# Patient Record
Sex: Female | Born: 1960 | Race: White | Hispanic: No | Marital: Married | Smoking: Never smoker
Health system: Southern US, Community
[De-identification: ages and names within clinical notes are randomized; demographics above are authoritative.]

## PROBLEM LIST (undated history)

## (undated) DIAGNOSIS — S0300XA Dislocation of jaw, unspecified side, initial encounter: Secondary | ICD-10-CM

## (undated) DIAGNOSIS — T7840XA Allergy, unspecified, initial encounter: Secondary | ICD-10-CM

## (undated) DIAGNOSIS — M1990 Unspecified osteoarthritis, unspecified site: Secondary | ICD-10-CM

## (undated) DIAGNOSIS — G43909 Migraine, unspecified, not intractable, without status migrainosus: Secondary | ICD-10-CM

## (undated) DIAGNOSIS — K219 Gastro-esophageal reflux disease without esophagitis: Secondary | ICD-10-CM

## (undated) DIAGNOSIS — N83209 Unspecified ovarian cyst, unspecified side: Secondary | ICD-10-CM

## (undated) DIAGNOSIS — N946 Dysmenorrhea, unspecified: Secondary | ICD-10-CM

## (undated) HISTORY — PX: CHOLECYSTECTOMY: SHX55

## (undated) HISTORY — PX: KNEE SURGERY: SHX244

## (undated) HISTORY — DX: Migraine, unspecified, not intractable, without status migrainosus: G43.909

## (undated) HISTORY — DX: Unspecified ovarian cyst, unspecified side: N83.209

## (undated) HISTORY — DX: Unspecified osteoarthritis, unspecified site: M19.90

## (undated) HISTORY — DX: Dislocation of jaw, unspecified side, initial encounter: S03.00XA

## (undated) HISTORY — DX: Gastro-esophageal reflux disease without esophagitis: K21.9

## (undated) HISTORY — DX: Dysmenorrhea, unspecified: N94.6

## (undated) HISTORY — DX: Allergy, unspecified, initial encounter: T78.40XA

---

## 2011-09-25 HISTORY — PX: ENDOMETRIAL ABLATION: SHX621

## 2012-06-11 LAB — HM COLONOSCOPY

## 2013-05-30 DIAGNOSIS — Z9851 Tubal ligation status: Secondary | ICD-10-CM | POA: Insufficient documentation

## 2013-05-30 HISTORY — DX: Tubal ligation status: Z98.51

## 2013-10-31 ENCOUNTER — Ambulatory Visit (INDEPENDENT_AMBULATORY_CARE_PROVIDER_SITE_OTHER): Payer: Medicare HMO

## 2013-10-31 ENCOUNTER — Ambulatory Visit (INDEPENDENT_AMBULATORY_CARE_PROVIDER_SITE_OTHER): Payer: Medicare HMO | Admitting: Podiatrist

## 2013-10-31 ENCOUNTER — Encounter: Payer: Self-pay | Admitting: Podiatrist

## 2013-10-31 DIAGNOSIS — R52 Pain, unspecified: Secondary | ICD-10-CM

## 2013-10-31 DIAGNOSIS — M7751 Other enthesopathy of right foot: Secondary | ICD-10-CM

## 2013-10-31 DIAGNOSIS — M7750 Other enthesopathy of unspecified foot: Secondary | ICD-10-CM

## 2013-10-31 NOTE — Progress Notes (Signed)
   Subjective:    Patient ID: Julie Thornton, female    DOB: Jun 26, 1960, 53 y.o.   MRN: [id number]  HPI I AM HAVING SOME PAIN IN MY RIGHT ANKLE DUE TO WE WERE MOVING AT THE END OF July AND IT POPS AND CRACKS AND GETS PUFFY WHEN I AM ON IT A LOT AND THROBS AND I DO ICE IT AND FEELS LIKE A KNIFE IN IT AND I TAKE ANTI-FLAMMATORIES    Review of Systems  HENT: Positive for sinus pressure.   All other systems reviewed and are negative.      Objective:   Physical Exam  Patient is awake, alert, and oriented x 3.  In no acute distress.  Vascular status is intact with palpable pedal pulses at 2/4 DP and PT bilateral and capillary refill time within normal limits. Neurological sensation is also intact bilaterally via Semmes Weinstein monofilament at 5/5 sites. Light touch, vibratory sensation, Achilles tendon reflex is intact. Dermatological exam reveals skin color, turger and texture as normal. No open lesions present.  Musculature intact with dorsiflexion, plantarflexion, inversion, eversion.  Pain along the lateral aspect of the ankle especially the sinus tarsi region is palpated. No pain along the anterior talofibular ligament or lateral ankle complex noted. Strength of the ankle is within normal limits. Range of motion is also within normal limits. No popping or crepitus is elicited at today's visit.     Assessment & Plan:  Capsulitis, mild ankle sprain right.  Plan: Injected the sinus tarsi region with dexamethasone and Marcaine mixture without complication under sterile technique. Gave instructions for a soft ankle brace. If the injection is not beneficial she will call.

## 2013-10-31 NOTE — Patient Instructions (Signed)
Acute Ankle Sprain with Phase I Rehab  HEAT AND COLD  Cold treatment (icing) is used to relieve pain and reduce inflammation for acute and chronic cases. Cold should be applied for 10 to 15 minutes every 2 to 3 hours for inflammation and pain and immediately after any activity that aggravates your symptoms. Use ice packs or an ice massage.  Heat treatment may be used before performing stretching and strengthening activities prescribed by your caregiver. Use a heat pack or a warm soak.   EXERCISES  PHASE I EXERCISES RANGE OF MOTION (ROM) AND STRETCHING EXERCISES - Ankle Sprain, Acute Phase I, Weeks 1 to 2 These exercises may help you when beginning to restore flexibility in your ankle. You will likely work on these exercises for the 1 to 2 weeks after your injury. Once your physician, physical therapist, or athletic trainer sees adequate progress, he or she will advance your exercises. While completing these exercises, remember:   Restoring tissue flexibility helps normal motion to return to the joints. This allows healthier, less painful movement and activity.  An effective stretch should be held for at least 30 seconds.  A stretch should never be painful. You should only feel a gentle lengthening or release in the stretched tissue. RANGE OF MOTION - Dorsi/Plantar Flexion  While sitting with your right / left knee straight, draw the top of your foot upwards by flexing your ankle. Then reverse the motion, pointing your toes downward.  Hold each position for __________ seconds.  After completing your first set of exercises, repeat this exercise with your knee bent. Repeat __________ times. Complete this exercise __________ times per day.  RANGE OF MOTION - Ankle Alphabet  Imagine your right / left big toe is a pen.  Keeping your hip and knee still, write out the entire alphabet with your "pen." Make the letters as large as you can without increasing any discomfort. Repeat __________  times. Complete this exercise __________ times per day.  STRENGTHENING EXERCISES - Ankle Sprain, Acute -Phase I, Weeks 1 to 2 These exercises may help you when beginning to restore strength in your ankle. You will likely work on these exercises for 1 to 2 weeks after your injury. Once your physician, physical therapist, or athletic trainer sees adequate progress, he or she will advance your exercises. While completing these exercises, remember:   Muscles can gain both the endurance and the strength needed for everyday activities through controlled exercises.  Complete these exercises as instructed by your physician, physical therapist, or athletic trainer. Progress the resistance and repetitions only as guided.  You may experience muscle soreness or fatigue, but the pain or discomfort you are trying to eliminate should never worsen during these exercises. If this pain does worsen, stop and make certain you are following the directions exactly. If the pain is still present after adjustments, discontinue the exercise until you can discuss the trouble with your clinician. STRENGTH - Dorsiflexors  Secure a rubber exercise band/tubing to a fixed object (i.e., table, pole) and loop the other end around your right / left foot.  Sit on the floor facing the fixed object. The band/tubing should be slightly tense when your foot is relaxed.  Slowly draw your foot back toward you using your ankle and toes.  Hold this position for __________ seconds. Slowly release the tension in the band and return your foot to the starting position. Repeat __________ times. Complete this exercise __________ times per day.  STRENGTH - Plantar-flexors   Sit  with your right / left leg extended. Holding onto both ends of a rubber exercise band/tubing, loop it around the ball of your foot. Keep a slight tension in the band.  Slowly push your toes away from you, pointing them downward.  Hold this position for __________  seconds. Return slowly, controlling the tension in the band/tubing. Repeat __________ times. Complete this exercise __________ times per day.  STRENGTH - Ankle Eversion  Secure one end of a rubber exercise band/tubing to a fixed object (table, pole). Loop the other end around your foot just before your toes.  Place your fists between your knees. This will focus your strengthening at your ankle.  Drawing the band/tubing across your opposite foot, slowly, pull your little toe out and up. Make sure the band/tubing is positioned to resist the entire motion.  Hold this position for __________ seconds. Have your muscles resist the band/tubing as it slowly pulls your foot back to the starting position.  Repeat __________ times. Complete this exercise __________ times per day.  STRENGTH - Ankle Inversion  Secure one end of a rubber exercise band/tubing to a fixed object (table, pole). Loop the other end around your foot just before your toes.  Place your fists between your knees. This will focus your strengthening at your ankle.  Slowly, pull your big toe up and in, making sure the band/tubing is positioned to resist the entire motion.  Hold this position for __________ seconds.  Have your muscles resist the band/tubing as it slowly pulls your foot back to the starting position. Repeat __________ times. Complete this exercises __________ times per day.  STRENGTH - Towel Curls  Sit in a chair positioned on a non-carpeted surface.  Place your right / left foot on a towel, keeping your heel on the floor.  Pull the towel toward your heel by only curling your toes. Keep your heel on the floor.  If instructed by your physician, physical therapist, or athletic trainer, add weight to the end of the towel. Repeat __________ times. Complete this exercise __________ times per day. Document Released: 09/24/2004 Document Revised: 07/10/2013 Document Reviewed: 06/07/2008 [facility] Patient Information  2015 [place], [place]. This information is not intended to replace advice given to you by your health care provider. Make sure you discuss any questions you have with your health care provider.

## 2014-05-24 ENCOUNTER — Ambulatory Visit (INDEPENDENT_AMBULATORY_CARE_PROVIDER_SITE_OTHER): Payer: Managed Care, Other (non HMO)

## 2014-05-24 VITALS — BP 105/65 | HR 70 | Resp 18

## 2014-05-24 DIAGNOSIS — M25371 Other instability, right ankle: Secondary | ICD-10-CM | POA: Diagnosis not present

## 2014-05-24 DIAGNOSIS — M7671 Peroneal tendinitis, right leg: Secondary | ICD-10-CM | POA: Diagnosis not present

## 2014-05-24 DIAGNOSIS — R52 Pain, unspecified: Secondary | ICD-10-CM | POA: Diagnosis not present

## 2014-05-24 DIAGNOSIS — M7751 Other enthesopathy of right foot: Secondary | ICD-10-CM

## 2014-05-24 MED ORDER — TRIAMCINOLONE ACETONIDE 10 MG/ML IJ SUSP: 10.0000 mg | Freq: Once | INTRAMUSCULAR | Status: DC

## 2014-05-24 NOTE — Patient Instructions (Signed)
ICE INSTRUCTIONS  Apply ice or cold pack to the affected area at least 3 times a day for 10-15 minutes each time.  You should also use ice after prolonged activity or vigorous exercise.  Do not apply ice longer than 20 minutes at one time.  Always keep a cloth between your skin and the ice pack to prevent burns.  Being consistent and following these instructions will help control your symptoms.  We suggest you purchase a gel ice pack because they are reusable and do bit leak.  Some of them are designed to wrap around the area.  Use the method that works best for you.  Here are some other suggestions for icing.   Use a frozen bag of peas or corn-inexpensive and molds well to your body, usually stays frozen for 10 to 20 minutes.  Wet a towel with cold water and squeeze out the excess until it's damp.  Place in a bag in the freezer for 20 minutes. Then remove and use.   Alternate using a warm compress or hot compress for 10 minutes and ice pack for 10 minutes repeat 2 or 3 times every evening

## 2014-05-24 NOTE — Progress Notes (Signed)
   Subjective:    Patient ID: Julie Thornton, female    DOB: [birth year], 54 y.o.   MRN: [id number]  HPI DR Irving ShowsEGERTON GAVE ME A SHOT LAST AUGUST AND IT HAS DONE GREAT BUT I AM HURTING AGAIN AND NEED ANOTHER SHOT ON MY RIGHT ANKLE    Review of Systems Findings or systemic changes noted     Objective:   Physical Exam 54 year old white female well-developed well-nourished oriented 3 presents this time for follow-up of some ankle pain points to the sinus tarsi area lateral ankle gutter area of the right foot. Patient had a steroid injection is 16 months ago by relief: Recently is really exacerbated the area. She wears good shoes however does workout in the past or times and did so in a pair of low top athletic shoes my recommendation the future she always uses a high top type shoe or boot for uneven surface and training. Objectively reveals pedal pulses are palpable DP and PT +2 over 4 Refill time 3 seconds all digits epicritic and proprioceptive sensations intact and symmetric bilateral there is normal plantar response DTRs not elicited Achilles tendon unremarkable all sensation is otherwise unremarkable skin texture turgor normal no open wounds no ulcerslacerations muscle strength intact and symmetric there is pain on plantarflexion inversion of the ankle and foot pain along the anterior talofibular ligament sinus tarsi area of the right ankle lateral capsule. There is some tenderness along the peroneal although no posterior malleolar pain is noted no pain along the insertion of the peroneus just in the peri-barely malleoli are area. Pain on palpation and on range of motion is noted at this time likely some mild peroneal tendinitis as well as capsulitis and sinus tarsi and ATF ligament strain. X-rays revealed no abnormalities no other changes or difficulties are noted no history of ecchymosis or edema       Assessment & Plan:  Assessment chronic ankle instability history of anterior talofibular ligament  injury and posse peroneal tendinitis secondary to compensatory gait changes may recommendations for high top boots or shoes at all times per patient request my recommendation injection tendons Kenalog in general grams Marcaine plain are infiltrated into the sinus tarsi lateral ankle gutter of the right foot. Patient tolerated the injection well is given instructions for warm compress ice pack use of over-the-counter Advil or ibuprofen or NSAID as needed for pain ice as instructed although, comminution of alternating warm compresses ice pack is recommended at this time 18 stable shoes at all times including crocs around the house no barefoot no flimsy shoes or flip-flops and will consider wearing a high top boot for her work activities in the past or. Recheck in the future on an as-needed basis for any flareups or exacerbations 80 candidate for ankle stabilizer in the future and if further issues occur other noninvasive studies might need to be considered  Alvan Dameichard Vonte Rossin DPM

## 2016-03-09 HISTORY — PX: BACK SURGERY: SHX140

## 2016-07-07 LAB — HM PAP SMEAR: HM Pap smear: NORMAL

## 2016-08-10 ENCOUNTER — Other Ambulatory Visit: Payer: Self-pay | Admitting: Orthopedic Surgery

## 2016-08-10 DIAGNOSIS — M4807 Spinal stenosis, lumbosacral region: Secondary | ICD-10-CM

## 2016-08-21 ENCOUNTER — Ambulatory Visit
Admission: RE | Admit: 2016-08-21 | Discharge: 2016-08-21 | Disposition: A | Discharge status: Home / Self Care | Payer: Managed Care, Other (non HMO) | Source: Ambulatory Visit | Attending: Orthopedic Surgery | Admitting: Orthopedic Surgery

## 2016-08-21 DIAGNOSIS — M4807 Spinal stenosis, lumbosacral region: Secondary | ICD-10-CM

## 2016-08-21 MED ORDER — DIAZEPAM 5 MG PO TABS
10.0000 mg | ORAL_TABLET | Freq: Once | ORAL | Status: AC
  Administered 2016-08-21: 10 mg via ORAL

## 2016-08-21 MED ORDER — IOPAMIDOL (ISOVUE-M 200) INJECTION 41%
15.0000 mL | Freq: Once | INTRAMUSCULAR | Status: AC
  Administered 2016-08-21: 15 mL via INTRATHECAL

## 2016-08-21 NOTE — Discharge Instructions (Signed)

## 2018-01-18 LAB — HM MAMMOGRAPHY: HM Mammogram: NORMAL (ref 0–4)

## 2018-10-11 ENCOUNTER — Ambulatory Visit: Payer: Managed Care, Other (non HMO) | Admitting: Cardiology

## 2018-10-24 ENCOUNTER — Encounter: Payer: Self-pay | Admitting: Cardiology

## 2018-10-24 DIAGNOSIS — Z7189 Other specified counseling: Secondary | ICD-10-CM | POA: Insufficient documentation
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
## 2018-10-25 ENCOUNTER — Ambulatory Visit (INDEPENDENT_AMBULATORY_CARE_PROVIDER_SITE_OTHER): Payer: PRIVATE HEALTH INSURANCE | Admitting: Cardiology

## 2018-10-25 ENCOUNTER — Other Ambulatory Visit: Payer: Self-pay

## 2018-10-25 ENCOUNTER — Encounter: Payer: Self-pay | Admitting: Cardiology

## 2018-10-25 VITALS — BP 124/84 | HR 66 | Ht 62.0 in | Wt 158.0 lb

## 2018-10-25 DIAGNOSIS — Z7189 Other specified counseling: Secondary | ICD-10-CM

## 2018-10-25 DIAGNOSIS — I209 Angina pectoris, unspecified: Secondary | ICD-10-CM

## 2018-10-25 DIAGNOSIS — I10 Essential (primary) hypertension: Secondary | ICD-10-CM

## 2018-10-25 DIAGNOSIS — Z01812 Encounter for preprocedural laboratory examination: Secondary | ICD-10-CM

## 2018-10-25 DIAGNOSIS — R079 Chest pain, unspecified: Secondary | ICD-10-CM | POA: Diagnosis not present

## 2018-10-25 MED ORDER — METOPROLOL TARTRATE 50 MG PO TABS: 100.0000 mg | ORAL_TABLET | Freq: Once | ORAL | 0 refills | Status: DC

## 2018-10-25 MED ORDER — ASPIRIN EC 81 MG PO TBEC: 81.0000 mg | DELAYED_RELEASE_TABLET | Freq: Every day | ORAL | 3 refills | Status: AC

## 2018-10-25 NOTE — Patient Instructions (Addendum)
Medication Instructions:  Your physician has recommended you make the following change in your medication:   DECREASE aspirin 81 mg: Take 1 tablet daily  If you need a refill on your cardiac medications before your next appointment, please call your pharmacy.   Lab work: Your physician recommends that you return for lab work within 3-7 days before cardiac CTA: BMP. Please return to our office for lab work, no appointment needed. No need to fast beforehand.   If you have labs (blood work) drawn today and your tests are completely normal, you will receive your results only by: Marland Kitchen MyChart Message (if you have MyChart) OR . A paper copy in the mail If you have any lab test that is abnormal or we need to change your treatment, we will call you to review the results.  Testing/Procedures: Your physician has requested that you have cardiac CT. Cardiac computed tomography (CT) is a painless test that uses an x-ray machine to take clear, detailed pictures of your heart. For further information please visit [email or web address][email or web address]. Please follow instruction sheet as given.   [facility] [address] [place], [place] [address] [phone]  Please arrive at the [facility] main entrance of [facility] 30-45 minutes prior to test start time. Proceed to the [facility] Radiology Department (first floor) to check-in and test prep.  Please follow these instructions carefully (unless otherwise directed):  On the Night Before the Test: . Be sure to Drink plenty of water. . Do not consume any caffeinated/decaffeinated beverages or chocolate 12 hours prior to your test. . Do not take any antihistamines 12 hours prior to your test.  On the Day of the Test: . Drink plenty of water. Do not drink any water within one hour of the test. . Do not eat any food 4 hours prior to the test. . You may take your regular medications prior to the test.  . Take metoprolol (Lopressor) two hours  prior to test. . FEMALES- please wear underwire-free bra if available                 -If HR is less than 55 BPM- No Beta Blocker                -IF HR is greater than 55 BPM and patient is less than or equal to 19 yrs old Lopressor 100mg  x1.         After the Test: . Drink plenty of water. . After receiving IV contrast, you may experience a mild flushed feeling. This is normal. . On occasion, you may experience a mild rash up to 24 hours after the test. This is not dangerous. If this occurs, you can take Benadryl 25 mg and increase your fluid intake. . If you experience trouble breathing, this can be serious. If it is severe call 911 IMMEDIATELY. If it is mild, please call our office.    Please contact the cardiac imaging nurse navigator should you have any questions/concerns Marchia Bond, RN Navigator Cardiac Imaging [facility] [phone] Office  [phone] Cell    Follow-Up: At [facility], you and your health needs are our priority.  As part of our continuing mission to provide you with exceptional heart care, we have created designated Provider Care Teams.  These Care Teams include your primary Cardiologist (physician) and Advanced Practice Providers (APPs -  Physician Assistants and Nurse Practitioners) who all work together to provide you with the care  you need, when you need it. You will need a follow up appointment in 6 weeks.

## 2018-10-26 NOTE — Addendum Note (Signed)
Addended by: Stevan Born on: 10/26/2018 01:11 PM   Modules accepted: Orders

## 2018-11-11 ENCOUNTER — Telehealth (HOSPITAL_COMMUNITY): Payer: Self-pay | Admitting: Emergency Medicine

## 2018-11-11 NOTE — Telephone Encounter (Signed)
Called patient to prep her for CCTA, I requested that she have labs drawn today however patient requested to move CCTA appt back instead. Notified scheduling team of same. Will call closer to CCTA appt to prepare patient  Julie Bond RN Navigator Cardiac [facility] [phone] Office  [phone] Cell

## 2018-11-15 ENCOUNTER — Ambulatory Visit (HOSPITAL_COMMUNITY): Payer: PRIVATE HEALTH INSURANCE

## 2018-11-18 ENCOUNTER — Telehealth (HOSPITAL_COMMUNITY): Payer: Self-pay | Admitting: Emergency Medicine

## 2018-11-18 LAB — BASIC METABOLIC PANEL
BUN/Creatinine Ratio: 15 (ref 9–23)
BUN: 10 mg/dL (ref 6–24)
CO2: 23 mmol/L (ref 20–29)
Calcium: 8.8 mg/dL (ref 8.7–10.2)
Chloride: 105 mmol/L (ref 96–106)
Creatinine, Ser: 0.68 mg/dL (ref 0.57–1.00)
GFR calc Af Amer: 113 mL/min/{1.73_m2} (ref >59)
GFR calc non Af Amer: 98 mL/min/{1.73_m2} (ref >59)
Glucose: 92 mg/dL (ref 65–99)
Potassium: 4.7 mmol/L (ref 3.5–5.2)
Sodium: 138 mmol/L (ref 134–144)

## 2018-11-18 NOTE — Telephone Encounter (Signed)
Reaching out to patient to offer assistance regarding upcoming cardiac imaging study; pt verbalizes understanding of appt date/time, parking situation and where to check in, pre-test NPO status and medications ordered, and verified current allergies; name and call back number provided for further questions should they arise Marchia Bond RN Navigator Cardiac Imaging [facility] [phone] office [phone] cell  Pt resting HR 66 BP 124/84; requesting she only take 50mg  of prescribed metoprolol prior to CTA instead of ordered 100mg  . Pt verbalized understanding

## 2018-11-22 ENCOUNTER — Encounter (HOSPITAL_COMMUNITY): Payer: Self-pay

## 2018-11-22 ENCOUNTER — Ambulatory Visit (HOSPITAL_COMMUNITY)
Admission: RE | Admit: 2018-11-22 | Discharge: 2018-11-22 | Disposition: A | Discharge status: Home / Self Care | Payer: PRIVATE HEALTH INSURANCE | Source: Ambulatory Visit | Attending: Cardiology | Admitting: Cardiology

## 2018-11-22 ENCOUNTER — Other Ambulatory Visit: Payer: Self-pay

## 2018-11-22 DIAGNOSIS — I209 Angina pectoris, unspecified: Secondary | ICD-10-CM | POA: Insufficient documentation

## 2018-11-22 DIAGNOSIS — Z7189 Other specified counseling: Secondary | ICD-10-CM | POA: Insufficient documentation

## 2018-11-22 MED ORDER — NITROGLYCERIN 0.4 MG SL SUBL
0.8000 mg | SUBLINGUAL_TABLET | SUBLINGUAL | Status: DC | PRN
  Administered 2018-11-22: 0.8 mg via SUBLINGUAL
  Filled 2018-11-22 (×2): qty 25

## 2018-11-22 MED ORDER — NITROGLYCERIN 0.4 MG SL SUBL
SUBLINGUAL_TABLET | SUBLINGUAL | Status: AC
  Filled 2018-11-22: qty 2

## 2018-11-22 MED ORDER — IOHEXOL 350 MG/ML SOLN
80.0000 mL | Freq: Once | INTRAVENOUS | Status: AC | PRN
  Administered 2018-11-22: 100 mL via INTRAVENOUS

## 2018-11-22 NOTE — Progress Notes (Signed)
Pt tolerated exam without incident.  PIV removed and dressing applied.  Pt provided with caffeine beverage and crackers post exam.  Discharge instructions discussed with patient, all questions answered.  Pt discharged

## 2018-11-22 NOTE — Discharge Instructions (Signed)
Testing With IV Contrast Material °IV contrast material is a fluid that is used with some imaging tests. It is injected into your body through a vein. Contrast material is used when your health care providers need a detailed look at organs, tissues, or blood vessels that may not show up with the standard test. The material may be used when an X-ray, an MRI, a CT scan, or an ultrasound is done. °IV contrast material may be used for imaging tests that check: °· Muscles, skin, and fat. °· Breasts. °· Brain. °· Digestive tract. °· Heart. °· Organs such as the liver, kidneys, lungs, bladder, and many others. °· Arteries and veins. °Tell a health care provider about: °· Any allergies you have, especially an allergy to contrast material. °· All medicines you are taking, including metformin, beta blockers, NSAIDs (such as ibuprofen), interleukin-2, vitamins, herbs, eye drops, creams, and over-the-counter medicines. °· Any problems you or family members have had with the use of contrast material. °· Any blood disorders you have, such as sickle cell anemia. °· Any surgeries you have had. °· Any medical conditions you have or have had, especially alcohol abuse, dehydration, asthma, or kidney, liver, or heart problems. °· Whether you are pregnant or may be pregnant. °· Whether you are breastfeeding. Most contrast materials are safe for use in breastfeeding women. °What are the risks? °Generally, this is a safe procedure. However, problems may occur, including: °· Headache. °· Itching, skin rash, and hives. °· Nausea and vomiting. °· Allergic reactions. °· Wheezing or difficulty breathing. °· Abnormal heart rate. °· Changes in blood pressure. °· Throat swelling. °· Kidney damage. °What happens before the procedure? °Medicines °Ask your health care provider about: °· Changing or stopping your regular medicines. This is especially important if you are taking diabetes medicines or blood thinners. °· Taking medicines such as aspirin  and ibuprofen. These medicines can thin your blood. Do not take these medicines unless your health care provider tells you to take them. °· Taking over-the-counter medicines, vitamins, herbs, and supplements. °If you are at risk of having a reaction to the IV contrast material, you may be asked to take medicine before the procedure to prevent a reaction. °General instructions °· Follow instructions from your health care provider about eating or drinking restrictions. °· You may have an exam or lab tests to make sure that you can safely get IV contrast material. °· Ask if you will be given a medicine to help you relax (sedative) during the procedure. If so, plan to have someone take you home from the hospital or clinic. °What happens during the procedure? °· You may be given a sedative to help you relax. °· An IV will be inserted into one of your veins. °· Contrast material will be injected into your IV. °· You may feel warmth or flushing as the contrast material enters your bloodstream. °· You may have a metallic taste in your mouth for a few minutes. °· The needle may cause some discomfort and bruising. °· After the contrast material is in your body, the imaging test will be done. °The procedure may vary among health care providers and hospitals. °What can I expect after the procedure? °· The IV will be removed. °· You may be taken to a recovery area if sedation medicines were used. Your blood pressure, heart rate, breathing rate, and blood oxygen level will be monitored until you leave the hospital or clinic. °Follow these instructions at home: ° °· Take over-the-counter and   prescription medicines only as told by your health care provider. °? Your health care provider may tell you to not take certain medicines for a couple of days after the procedure. This is especially important if you are taking diabetes medicines. °· If you are told, drink enough fluid to keep your urine pale yellow. This will help to remove  the contrast material out of your body. °· Do not drive for 24 hours if you were given a sedative during your procedure. °· It is up to you to get the results of your procedure. Ask your health care provider, or the department that is doing the procedure, when your results will be ready. °· Keep all follow-up visits as told by your health care provider. This is important. °Contact a health care provider if: °· You have redness, swelling, or pain near your IV site. °Get help right away if: °· You have an abnormal heart rhythm. °· You have trouble breathing. °· You have: °? Chest pain. °? Pain in your back, neck, arm, jaw, or stomach. °? Nausea or sweating. °? Hives or a rash. °· You start shaking and cannot stop. °These symptoms may represent a serious problem that is an emergency. Do not wait to see if the symptoms will go away. Get medical help right away. Call your local emergency services (911 in the [place] Do not drive yourself to the hospital. °Summary °· IV contrast material may be used for imaging tests to help your health care providers see your organs and tissues more clearly. °· Tell your health care provider if you are pregnant or may be pregnant. °· During the procedure, you may feel warmth or flushing as the contrast material enters your bloodstream. °· After the procedure, drink enough fluid to keep your urine pale yellow. °This information is not intended to replace advice given to you by your health care provider. Make sure you discuss any questions you have with your health care provider. °Document Released: 02/11/2009 Document Revised: 05/12/2018 Document Reviewed: 05/12/2018 °Elsevier Patient Education © 2020 [employer] ° ° °CT Angiogram ° °A CT angiogram is a procedure to look at the blood vessels in various areas of the body. For this procedure, a large X-ray machine, called a CT scanner, takes detailed pictures of blood vessels that have been injected with a dye (contrast material). °A CT  angiogram allows your health care provider to see how well blood is flowing to the area of your body that is being checked. Your health care provider will be able to see if there are any problems, such as a blockage. °Tell a health care provider about: °· Any allergies you have. °· All medicines you are taking, including vitamins, herbs, eye drops, creams, and over-the-counter medicines. °· Any problems you or family members have had with anesthetic medicines. °· Any blood disorders you have. °· Any surgeries you have had. °· Any medical conditions you have. °· Whether you are pregnant or may be pregnant. °· Whether you are breastfeeding. °· Any anxiety disorders, chronic pain, or other conditions you have that may increase your stress or prevent you from lying still. °What are the risks? °Generally, this is a safe procedure. However, problems may occur, including: °· Infection. °· Bleeding. °· Allergic reactions to medicines or dyes. °· Damage to other structures or organs. °· Kidney damage from the dye or contrast that is used. °· Increased risk of cancer from radiation exposure. This risk is low. Talk with your health care provider   about: °? The risks and benefits of testing. °? How you can receive the lowest dose of radiation. °What happens before the procedure? °· Wear comfortable clothing and remove any jewelry. °· Follow instructions from your health care provider about eating and drinking. For most people, instructions may include these actions: °? For 12 hours before the test, avoid caffeine. This includes tea, coffee, soda, and energy drinks or pills. °? For 3-4 hours before the test, stop eating or drinking anything but water. °? Stay well hydrated by continuing to drink water before the exam. This will help to clear the contrast dye from your body after the test. °· Ask your health care provider about changing or stopping your regular medicines. This is especially important if you are taking diabetes  medicines or blood thinners. °What happens during the procedure? °· An IV tube will be inserted into one of your veins. °· You will be asked to lie on an exam table. This table will slide in and out of the CT machine during the procedure. °· Contrast dye will be injected into the IV tube. You might feel warm, or you may get a metallic taste in your mouth. °· The table that you are lying on will move into the CT machine tunnel for the scan. °· The person running the machine will give you instructions while the scans are being done. You may be asked to: °? Keep your arms above your head. °? Hold your breath. °? Stay very still, even if the table is moving. °· When the scanning is complete, you will be moved out of the machine. °· The IV tube will be removed. °The procedure may vary among health care providers and hospitals. °What happens after the procedure? °· You might feel warm, or you may get a metallic taste in your mouth. °· You may be asked to drink water or other fluids to wash (flush) the contrast material out of your body. °· It is up to you to get the results of your procedure. Ask your health care provider, or the department that is doing the procedure, when your results will be ready. °Summary °· A CT angiogram is a procedure to look at the blood vessels in various areas of the body. °· You will need to stay very still during the exam. °· You may be asked to drink water or other fluids to wash (flush) the contrast material out of your body after your scan. °This information is not intended to replace advice given to you by your health care provider. Make sure you discuss any questions you have with your health care provider. °Document Released: 10/24/2015 Document Revised: 05/05/2018 Document Reviewed: 10/24/2015 °Elsevier Patient Education © 2020 [employer] Inc[employer] ° ° °

## 2019-01-01 NOTE — Progress Notes (Signed)
Cardiology Office Note:    Date:  01/04/2019   ID:  Julie Thornton, DOB 12-May-1960, MRN [id number]  PCP:  Rochel Brome, MD  Cardiologist:  Shirlee More, MD    Referring MD: Rochel Brome, MD    ASSESSMENT:    1. Chest pain, unspecified type   2. Essential hypertension    PLAN:    In order of problems listed above:  1. Chest pain is resolved her cardiac CTA virtually excludes cardiac etiologies and she thinks in retrospect stress especially caring for her father played a major role.  At this time I do no further cardiac diagnostic test I would not initiate lipid-lowering treatment.  I will see back in the office as needed 2. BP stable at target continue ACE inhibitors   Next appointment: As needed   Medication Adjustments/Labs and Tests Ordered: Current medicines are reviewed at length with the patient today.  Concerns regarding medicines are outlined above.  No orders of the defined types were placed in this encounter.  No orders of the defined types were placed in this encounter.   Chief Complaint  Patient presents with  . Follow-up    aftyer testing  . Hypertension    History of Present Illness:    Julie Thornton is a 58 y.o. female with a hx of chest pain and hypertension last seen 10/25/2018. Compliance with diet, lifestyle and medications: Yes  Reviewed the cardiac CTA report with the patient.  She is very reassured and had no further chest pain ask about lipid-lowering therapy and I told her in my opinion for the next 35 years its not necessarily calcium score of 0 and a lipid profile with a cholesterol 182 LDL 100 HDL 63 in the absence of known vascular disease.  She is compliant with her antihypertensives BP at target.  No edema shortness of breath palpitation chest pain or syncope  Cardiac CTA 0918/2020: IMPRESSION: 1. Coronary calcium score of 0. This was 0 percentile for age and sex matched control. 2. Normal coronary origin with left dominance. 3. No evidence  of CAD. Past Medical History:  Diagnosis Date  . Allergy   . Arthritis   . Dysmenorrhea   . GERD (gastroesophageal reflux disease)   . Migraine   . Ovarian cyst   . TMJ (dislocation of temporomandibular joint)     Past Surgical History:  Procedure Laterality Date  . BACK SURGERY  2018  . CHOLECYSTECTOMY    . ENDOMETRIAL ABLATION  09/25/2011  . KNEE SURGERY      Current Medications: Current Meds  Medication Sig  . aspirin EC 81 MG tablet Take 1 tablet (81 mg total) by mouth daily.  . cetirizine (ZYRTEC) 5 MG tablet Take 10 mg by mouth daily as needed.   Marland Kitchen estradiol (ESTRACE) 1 MG tablet Take 1 mg by mouth daily.  . Famotidine (PEPCID AC MAXIMUM STRENGTH) 20 MG CHEW Chew 1 each by mouth daily as needed.  . fluticasone (FLONASE) 50 MCG/ACT nasal spray Place 1 spray into both nostrils daily as needed.   Marland Kitchen lisinopril (PRINIVIL,ZESTRIL) 10 MG tablet Take 10 mg by mouth daily.  . medroxyPROGESTERone (PROVERA) 2.5 MG tablet Take 2.5 mg by mouth daily.  . Multiple Vitamins-Minerals (THERA-M) TABS Take 1 tablet by mouth daily.      Allergies:   Erythromycin and Penicillins   Social History   Socioeconomic History  . Marital status: Married    Spouse name: Not on file  . Number of children: Not on  file  . Years of education: Not on file  . Highest education level: Not on file  Occupational History  . Not on file  Social Needs  . Financial resource strain: Not on file  . Food insecurity    Worry: Not on file    Inability: Not on file  . Transportation needs    Medical: Not on file    Non-medical: Not on file  Tobacco Use  . Smoking status: Never Smoker  . Smokeless tobacco: Never Used  Substance and Sexual Activity  . Alcohol use: Yes    Alcohol/week: 0.0 standard drinks    Comment: occ-rare  . Drug use: Never  . Sexual activity: Not on file  Lifestyle  . Physical activity    Days per week: Not on file    Minutes per session: Not on file  . Stress: Not on file   Relationships  . Social Musician on phone: Not on file    Gets together: Not on file    Attends religious service: Not on file    Active member of club or organization: Not on file    Attends meetings of clubs or organizations: Not on file    Relationship status: Not on file  Other Topics Concern  . Not on file  Social History Narrative  . Not on file     Family History: The patient's family history includes Arrhythmia in her father; Congestive Heart Failure in her father; Fibroids in her mother; Hypertension in her father and mother; Kidney failure in her mother; Ovarian cancer in her maternal grandmother. ROS:   Please see the history of present illness.    All other systems reviewed and are negative.  EKGs/Labs/Other Studies Reviewed:    The following studies were reviewed today:    Recent Labs: 11/17/2018: BUN 10; Creatinine, Ser 0.68; Potassium 4.7; Sodium 138  Recent Lipid Panel No results found for: CHOL, TRIG, HDL, CHOLHDL, VLDL, LDLCALC, LDLDIRECT  Physical Exam:    VS:  BP 110/78 (BP Location: Right Arm, Patient Position: Sitting, Cuff Size: Normal)   Pulse 70   Ht 5\' 2"  (1.575 m)   Wt 156 lb 3.2 oz (70.9 kg)   SpO2 99%   BMI 28.57 kg/m     Wt Readings from Last 3 Encounters:  01/04/19 156 lb 3.2 oz (70.9 kg)  10/25/18 158 lb (71.7 kg)  08/17/16 150 lb (68 kg)     GEN:  Well nourished, well developed in no acute distress HEENT: Normal NECK: No JVD; No carotid bruits LYMPHATICS: No lymphadenopathy CARDIAC: RRR, no murmurs, rubs, gallops RESPIRATORY:  Clear to auscultation without rales, wheezing or rhonchi  ABDOMEN: Soft, non-tender, non-distended MUSCULOSKELETAL:  No edema; No deformity  SKIN: Warm and dry NEUROLOGIC:  Alert and oriented x 3 PSYCHIATRIC:  Normal affect    Signed, 10/17/16, MD  01/04/2019 8:57 AM    [facility]

## 2019-01-04 ENCOUNTER — Encounter: Payer: Self-pay | Admitting: Cardiology

## 2019-01-04 ENCOUNTER — Other Ambulatory Visit: Payer: Self-pay

## 2019-01-04 ENCOUNTER — Ambulatory Visit (INDEPENDENT_AMBULATORY_CARE_PROVIDER_SITE_OTHER): Payer: PRIVATE HEALTH INSURANCE | Admitting: Cardiology

## 2019-01-04 VITALS — BP 110/78 | HR 70 | Ht 62.0 in | Wt 156.2 lb

## 2019-01-04 DIAGNOSIS — I10 Essential (primary) hypertension: Secondary | ICD-10-CM

## 2019-01-04 DIAGNOSIS — R079 Chest pain, unspecified: Secondary | ICD-10-CM | POA: Diagnosis not present

## 2019-01-04 NOTE — Patient Instructions (Signed)
Medication Instructions:  Your physician recommends that you continue on your current medications as directed. Please refer to the Current Medication list given to you today.  *If you need a refill on your cardiac medications before your next appointment, please call your pharmacy*  Lab Work: None  If you have labs (blood work) drawn today and your tests are completely normal, you will receive your results only by: . MyChart Message (if you have MyChart) OR . A paper copy in the mail If you have any lab test that is abnormal or we need to change your treatment, we will call you to review the results.  Testing/Procedures: None  Follow-Up: At [facility], you and your health needs are our priority.  As part of our continuing mission to provide you with exceptional heart care, we have created designated Provider Care Teams.  These Care Teams include your primary Cardiologist (physician) and Advanced Practice Providers (APPs -  Physician Assistants and Nurse Practitioners) who all work together to provide you with the care you need, when you need it.  Your next appointment:   as needed if symptoms worsen or fail to improve  The format for your next appointment:   In Person  Provider:   Brian Munley, MD   

## 2019-04-17 ENCOUNTER — Other Ambulatory Visit: Payer: Self-pay

## 2019-04-17 ENCOUNTER — Encounter: Payer: Self-pay | Admitting: Family Medicine

## 2019-04-17 ENCOUNTER — Ambulatory Visit (INDEPENDENT_AMBULATORY_CARE_PROVIDER_SITE_OTHER): Payer: 59 | Admitting: Family Medicine

## 2019-04-17 VITALS — BP 128/76 | HR 75 | Temp 98.7°F | Ht 62.0 in | Wt 157.0 lb

## 2019-04-17 DIAGNOSIS — N3000 Acute cystitis without hematuria: Secondary | ICD-10-CM | POA: Diagnosis not present

## 2019-04-17 LAB — POCT WET PREP (WET MOUNT)
Trichomonas Wet Prep HPF POC: ABSENT
WBC, Wet Prep HPF POC: 3

## 2019-04-17 LAB — POCT URINALYSIS DIPSTICK
Bilirubin, UA: NEGATIVE
Blood, UA: NEGATIVE
Glucose, UA: NEGATIVE
Ketones, UA: NEGATIVE
Leukocytes, UA: NEGATIVE
Nitrite, UA: NEGATIVE
Protein, UA: POSITIVE — AB
Spec Grav, UA: 1.015 (ref 1.010–1.025)
Urobilinogen, UA: 0.2 E.U./dL
pH, UA: 6.5 (ref 5.0–8.0)

## 2019-04-17 LAB — POCT WET PREP WITH KOH: Trichomonas, UA: NEGATIVE

## 2019-04-17 MED ORDER — SULFAMETHOXAZOLE-TRIMETHOPRIM 800-160 MG PO TABS: 1.0000 | ORAL_TABLET | Freq: Two times a day (BID) | ORAL | 0 refills | Status: AC

## 2019-04-17 MED ORDER — PHENAZOPYRIDINE HCL 200 MG PO TABS: 200.0000 mg | ORAL_TABLET | Freq: Three times a day (TID) | ORAL | 0 refills | Status: AC | PRN

## 2019-04-17 NOTE — Patient Instructions (Addendum)
  Acute cystitis Start on Bactrim DS one twice a day x 5 days.  Start on pyridium 200 mg one three times a day as needed for dysuria. Sent UA and Urine culture. Stop macrobid as her symptoms have not improved. Wet prep/KOH negative. If no  Improvement with bactrim ds, consider premarin cream for atrophic vaginitis.

## 2019-04-17 NOTE — Progress Notes (Signed)
Established Patient Office Visit  Subjective:  Patient ID: Julie Thornton, female    DOB: 12/14/1960  Age: 59 y.o. MRN: [id number]  CC:  Chief Complaint  Patient presents with  . Dysuria    HPI Julie Thornton presents for Dysuria  This is a recurrent problem. The current episode started in the past 7 days. The problem occurs every urination. The problem has been unchanged. The quality of the pain is described as burning. There has been no fever. The fever has been present for 5 days or more. She is sexually active. There is no history of pyelonephritis. Associated symptoms include frequency and urgency. Pertinent negatives include no chills, discharge, flank pain, nausea or vomiting. She has tried antibiotics and increased fluids (Macrobid) for the symptoms. The treatment provided no relief.  She was seen at [facility] on 2/5/201  Past Medical History:  Diagnosis Date  . Allergy   . Arthritis   . Dysmenorrhea   . GERD (gastroesophageal reflux disease)   . Migraine   . Ovarian cyst   . TMJ (dislocation of temporomandibular joint)     Past Surgical History:  Procedure Laterality Date  . BACK SURGERY  2018  . CHOLECYSTECTOMY    . ENDOMETRIAL ABLATION  09/25/2011  . KNEE SURGERY      Family History  Problem Relation Age of Onset  . Hypertension Mother   . Fibroids Mother   . Kidney failure Mother   . Chronic Renal Failure Mother   . Arrhythmia Mother        A-Fib  . Hypertension Father   . Congestive Heart Failure Father   . Arrhythmia Father   . Ovarian cancer Maternal Grandmother   . Osteoarthritis Other     Social History   Socioeconomic History  . Marital status: Married    Spouse name: Not on file  . Number of children: Not on file  . Years of education: Not on file  . Highest education level: Not on file  Occupational History  . Not on file  Tobacco Use  . Smoking status: Never Smoker  . Smokeless tobacco: Never Used  Substance and Sexual Activity  . Alcohol  use: Yes    Alcohol/week: 0.0 standard drinks    Comment: occ-rare  . Drug use: Never  . Sexual activity: Not on file  Other Topics Concern  . Not on file  Social History Narrative  . Not on file   Social Determinants of Health   Financial Resource Strain:   . Difficulty of Paying Living Expenses: Not on file  Food Insecurity:   . Worried About Charity fundraiser in the Last Year: Not on file  . Ran Out of Food in the Last Year: Not on file  Transportation Needs:   . Lack of Transportation (Medical): Not on file  . Lack of Transportation (Non-Medical): Not on file  Physical Activity:   . Days of Exercise per Week: Not on file  . Minutes of Exercise per Session: Not on file  Stress:   . Feeling of Stress : Not on file  Social Connections:   . Frequency of Communication with Friends and Family: Not on file  . Frequency of Social Gatherings with Friends and Family: Not on file  . Attends Religious Services: Not on file  . Active Member of Clubs or Organizations: Not on file  . Attends Archivist Meetings: Not on file  . Marital Status: Not on file  Intimate Partner Violence:   .  Fear of Current or Ex-Partner: Not on file  . Emotionally Abused: Not on file  . Physically Abused: Not on file  . Sexually Abused: Not on file    Outpatient Medications Prior to Visit  Medication Sig Dispense Refill  . aspirin EC 81 MG tablet Take 1 tablet (81 mg total) by mouth daily. 90 tablet 3  . cetirizine (ZYRTEC) 5 MG tablet Take 10 mg by mouth daily as needed.     Marland Kitchen estradiol (ESTRACE) 1 MG tablet Take 1 mg by mouth daily.    . Famotidine (PEPCID AC MAXIMUM STRENGTH) 20 MG CHEW Chew 1 each by mouth daily as needed.    . fluticasone (FLONASE) 50 MCG/ACT nasal spray Place 1 spray into both nostrils daily as needed.     Marland Kitchen lisinopril (PRINIVIL,ZESTRIL) 10 MG tablet Take 10 mg by mouth daily.    . medroxyPROGESTERone (PROVERA) 2.5 MG tablet Take 2.5 mg by mouth daily.    . Multiple  Vitamins-Minerals (THERA-M) TABS Take 1 tablet by mouth daily.     . nitrofurantoin, macrocrystal-monohydrate, (MACROBID) 100 MG capsule Take 100 mg by mouth 2 (two) times daily.     No facility-administered medications prior to visit.    Allergies  Allergen Reactions  . Erythromycin Other (See Comments)    Chest pain  . Penicillins Rash    ROS Review of Systems  Constitutional: Negative for chills and fever.  HENT: Negative for congestion.   Respiratory: Negative for cough and shortness of breath.   Gastrointestinal: Negative for abdominal pain, diarrhea, nausea and vomiting.  Endocrine: Negative for cold intolerance, heat intolerance, polydipsia and polyphagia.  Genitourinary: Positive for dysuria, frequency and urgency. Negative for flank pain.  Musculoskeletal: Negative for back pain.  Skin: Negative.  Negative for rash.  Neurological: Negative for dizziness, weakness, light-headedness and headaches.  Psychiatric/Behavioral: Negative for sleep disturbance. The patient is not nervous/anxious.       Objective:     BP 128/76 (BP Location: Right Arm, Patient Position: Sitting)   Pulse 75   Temp 98.7 F (37.1 C)   Ht 5\' 2"  (1.575 m)   Wt 157 lb (71.2 kg)   SpO2 99%   BMI 28.72 kg/m  Wt Readings from Last 3 Encounters:  04/17/19 157 lb (71.2 kg)  01/04/19 156 lb 3.2 oz (70.9 kg)  10/25/18 158 lb (71.7 kg)     General appearance: alert Head: Normocephalic, without obvious abnormality, atraumatic Eyes: negative, conjunctivae/corneas clear. PERRL, EOM's intact. Fundi benign. Nose: Nares normal. Septum midline. Mucosa normal. No drainage or sinus tenderness. Throat: normal findings: lips normal without lesions and tongue midline and normal Lungs: clear to auscultation bilaterally Heart: regular rate and rhythm, S1, S2 normal, no murmur, click, rub or gallop Abdomen: abnormal findings:  mild tenderness in the lower abdomen Pelvic: cervix normal in appearance, external  genitalia normal, no bladder tenderness, rectovaginal septum normal, uterus normal size, shape, and consistency and vagina normal without discharge Skin: No rashes or lesions   Assessment:    Acute cystitis and UTI     Plan:    Medications: TMP/SMX.

## 2019-04-17 NOTE — Assessment & Plan Note (Addendum)
Start on Bactrim DS one twice a day x 5 days.  Start on pyridium 200 mg one three times a day as needed for dysuria. Sent UA and Urine culture. Stop macrobid as her symptoms have not improved. Wet prep/KOH negative. If no  Improvement with bactrim ds, consider premarin cream for atrophic vaginitis.

## 2019-04-18 LAB — URINE CULTURE

## 2019-05-03 ENCOUNTER — Other Ambulatory Visit: Payer: Self-pay | Admitting: Family Medicine

## 2019-05-03 MED ORDER — ESTRADIOL 2 MG PO TABS: 2.0000 mg | ORAL_TABLET | Freq: Every day | ORAL | 0 refills | Status: DC

## 2019-05-16 ENCOUNTER — Other Ambulatory Visit: Payer: Self-pay | Admitting: Family Medicine

## 2019-06-13 ENCOUNTER — Telehealth (INDEPENDENT_AMBULATORY_CARE_PROVIDER_SITE_OTHER): Payer: 59 | Admitting: Physician Assistant

## 2019-06-13 ENCOUNTER — Encounter: Payer: Self-pay | Admitting: Physician Assistant

## 2019-06-13 VITALS — BP 110/71 | HR 83 | Temp 98.5°F

## 2019-06-13 DIAGNOSIS — K529 Noninfective gastroenteritis and colitis, unspecified: Secondary | ICD-10-CM | POA: Insufficient documentation

## 2019-06-13 DIAGNOSIS — Z7189 Other specified counseling: Secondary | ICD-10-CM

## 2019-06-13 DIAGNOSIS — R5381 Other malaise: Secondary | ICD-10-CM | POA: Insufficient documentation

## 2019-06-13 MED ORDER — PROMETHAZINE HCL 25 MG PO TABS: 25.0000 mg | ORAL_TABLET | Freq: Three times a day (TID) | ORAL | 0 refills | Status: DC | PRN

## 2019-06-13 NOTE — Assessment & Plan Note (Signed)
Rest, fluids COVID test pending - recommend quarantine until get results

## 2019-06-13 NOTE — Assessment & Plan Note (Signed)
Clear liquids, bland diet Rest, fluids 

## 2019-06-13 NOTE — Progress Notes (Signed)
Virtual Visit via Telephone Note   This visit type was conducted due to national recommendations for restrictions regarding the COVID-19 Pandemic (e.g. social distancing) in an effort to limit this patient's exposure and mitigate transmission in our community.  Due to her co-morbid illnesses, this patient is at least at moderate risk for complications without adequate follow up.  This format is felt to be most appropriate for this patient at this time.  The patient did not have access to video technology/had technical difficulties with video requiring transitioning to audio format only (telephone).  All issues noted in this document were discussed and addressed.  No physical exam could be performed with this format.  Patient verbally consented to a telehealth visit.   Date:  06/13/2019   ID:  Julie Thornton, DOB May 13, 1960, MRN [id number]  Patient Location: Home Provider Location: Office  PCP:  Blane Ohara, MD    Chief Complaint:  Malaise, vomiting and diarrhea  History of Present Illness:    Julie Thornton is a 59 y.o. female with malaise and GI symptoms Pt states that she had taken care of her father earlier in the week with same symptoms Pt complains of nausea, vomiting, diarrhea, fatigue and fever Her symptoms started 3 days ago - her diarrhea and vomiting have stopped.  She ran a fever of 101 yesterday but none today.  She is eating some now and able to drink and keep down - she is still very tired and fatigued Of note has had both COVID vaccinesThe patient does have symptoms concerning for COVID-19 infection (fever, chills, cough, or new shortness of breath).    Past Medical History:  Diagnosis Date  . Allergy   . Arthritis   . Dysmenorrhea   . GERD (gastroesophageal reflux disease)   . Migraine   . Ovarian cyst   . TMJ (dislocation of temporomandibular joint)    Past Surgical History:  Procedure Laterality Date  . BACK SURGERY  2018  . CHOLECYSTECTOMY    . ENDOMETRIAL  ABLATION  09/25/2011  . KNEE SURGERY       No outpatient medications have been marked as taking for the 06/13/19 encounter (Video Visit) with Marianne Sofia, PA-C.     Allergies:   Erythromycin and Penicillins   Social History   Tobacco Use  . Smoking status: Never Smoker  . Smokeless tobacco: Never Used  Substance Use Topics  . Alcohol use: Yes    Alcohol/week: 0.0 standard drinks    Comment: occ-rare  . Drug use: Never     Family Hx: The patient's family history includes Arrhythmia in her father and mother; Chronic Renal Failure in her mother; Congestive Heart Failure in her father; Fibroids in her mother; Hypertension in her father and mother; Kidney failure in her mother; Osteoarthritis in an other family member; Ovarian cancer in her maternal grandmother.  ROS:   Please see the history of present illness.    All other systems reviewed and are negative.  Labs/Other Tests and Data Reviewed:    Recent Labs: 11/17/2018: BUN 10; Creatinine, Ser 0.68; Potassium 4.7; Sodium 138   Recent Lipid Panel No results found for: CHOL, TRIG, HDL, CHOLHDL, LDLCALC, LDLDIRECT  Wt Readings from Last 3 Encounters:  04/17/19 157 lb (71.2 kg)  01/04/19 156 lb 3.2 oz (70.9 kg)  10/25/18 158 lb (71.7 kg)     Objective:    Vital Signs:  BP 110/71   Pulse 83   Temp 98.5 F (36.9 C)  ASSESSMENT & PLAN:    1. gastroenteritis  COVID-19 Education: The signs and symptoms of COVID-19 were discussed with the patient and how to seek care for testing (follow up with PCP or arrange E-visit). The importance of social distancing was discussed today.  Time:   Today, I have spent 10 minutes with the patient with telehealth technology discussing the above problems.     Medication Adjustments/Labs and Tests Ordered: Current medicines are reviewed at length with the patient today.  Concerns regarding medicines are outlined above.   Tests Ordered: No orders of the defined types were placed in  this encounter.   Medication Changes: No orders of the defined types were placed in this encounter.   Follow Up:  Either In Person or Virtual prn  Signed, Webb Silversmith, PA-C  06/13/2019 1:42 PM    [facility] [employer]

## 2019-06-14 LAB — SARS-COV-2, NAA 2 DAY TAT

## 2019-06-14 LAB — NOVEL CORONAVIRUS, NAA: SARS-CoV-2, NAA: NOT DETECTED

## 2019-06-28 ENCOUNTER — Encounter: Payer: Self-pay | Admitting: Family Medicine

## 2019-07-31 ENCOUNTER — Other Ambulatory Visit: Payer: Self-pay | Admitting: Family Medicine

## 2019-08-04 ENCOUNTER — Other Ambulatory Visit: Payer: Self-pay | Admitting: Family Medicine

## 2019-09-04 NOTE — Progress Notes (Signed)
Acute Office Visit  Subjective:    Patient ID: Julie Thornton, female    DOB: 1961/01/22, 59 y.o.   MRN: [id number]  No chief complaint on file.   HPI Patient is in today for back pain (lower) patient states she picked up a bucket of water and twisted her back 1.5 weeks ago. Since that time period her lower back and right hip have been hurting. Pt reports having a pain that radiates from her right hip to knee. Has been applying heat and ice and taking ibuprofen which have helped some. Patient did take robaxin 250 mg (fathers rx) at night for several days last week with improvement in symptoms. Pt with previous back surgery S1-L4. No concerns since most recent episode..  Past Medical History:  Diagnosis Date  . Allergy   . Arthritis   . Dysmenorrhea   . GERD (gastroesophageal reflux disease)   . Migraine   . Ovarian cyst   . TMJ (dislocation of temporomandibular joint)     Past Surgical History:  Procedure Laterality Date  . BACK SURGERY  2018  . CHOLECYSTECTOMY    . ENDOMETRIAL ABLATION  09/25/2011  . KNEE SURGERY      Family History  Problem Relation Age of Onset  . Hypertension Mother   . Fibroids Mother   . Kidney failure Mother   . Chronic Renal Failure Mother   . Arrhythmia Mother        A-Fib  . Hypertension Father   . Congestive Heart Failure Father   . Arrhythmia Father   . Ovarian cancer Maternal Grandmother   . Osteoarthritis Other     Social History   Socioeconomic History  . Marital status: Married    Spouse name: Not on file  . Number of children: Not on file  . Years of education: Not on file  . Highest education level: Not on file  Occupational History  . Not on file  Tobacco Use  . Smoking status: Never Smoker  . Smokeless tobacco: Never Used  Vaping Use  . Vaping Use: Never used  Substance and Sexual Activity  . Alcohol use: Yes    Alcohol/week: 0.0 standard drinks    Comment: occ-rare  . Drug use: Never  . Sexual activity: Not on file   Other Topics Concern  . Not on file  Social History Narrative  . Not on file   Social Determinants of Health   Financial Resource Strain:   . Difficulty of Paying Living Expenses:   Food Insecurity:   . Worried About Charity fundraiser in the Last Year:   . Arboriculturist in the Last Year:   Transportation Needs:   . Film/video editor (Medical):   Marland Kitchen Lack of Transportation (Non-Medical):   Physical Activity:   . Days of Exercise per Week:   . Minutes of Exercise per Session:   Stress:   . Feeling of Stress :   Social Connections:   . Frequency of Communication with Friends and Family:   . Frequency of Social Gatherings with Friends and Family:   . Attends Religious Services:   . Active Member of Clubs or Organizations:   . Attends Archivist Meetings:   Marland Kitchen Marital Status:   Intimate Partner Violence:   . Fear of Current or Ex-Partner:   . Emotionally Abused:   Marland Kitchen Physically Abused:   . Sexually Abused:     Outpatient Medications Prior to Visit  Medication Sig Dispense Refill  .  aspirin EC 81 MG tablet Take 1 tablet (81 mg total) by mouth daily. 90 tablet 3  . cetirizine (ZYRTEC) 5 MG tablet Take 10 mg by mouth daily as needed.     Marland Kitchen estradiol (ESTRACE) 2 MG tablet Take 1 tablet by mouth once daily 90 tablet 0  . Famotidine (PEPCID AC MAXIMUM STRENGTH) 20 MG CHEW Chew 1 each by mouth daily as needed.    . fluticasone (FLONASE) 50 MCG/ACT nasal spray Place 1 spray into both nostrils daily as needed.     Marland Kitchen lisinopril (ZESTRIL) 10 MG tablet Take 1 tablet by mouth once daily 90 tablet 0  . medroxyPROGESTERone (PROVERA) 2.5 MG tablet Take 1 tablet by mouth once daily 90 tablet 0  . Multiple Vitamins-Minerals (THERA-M) TABS Take 1 tablet by mouth daily.     . nitrofurantoin, macrocrystal-monohydrate, (MACROBID) 100 MG capsule Take 100 mg by mouth 2 (two) times daily.    . promethazine (PHENERGAN) 25 MG tablet Take 1 tablet (25 mg total) by mouth every 8 (eight)  hours as needed for nausea or vomiting. 20 tablet 0   No facility-administered medications prior to visit.    Allergies  Allergen Reactions  . Erythromycin Other (See Comments)    Chest pain  . Penicillins Rash    Review of Systems  Constitutional: Negative for chills, fatigue and fever.  HENT: Negative for congestion, ear pain, rhinorrhea and sore throat.   Respiratory: Negative for cough and shortness of breath.   Cardiovascular: Negative for chest pain.  Gastrointestinal: Negative for constipation, diarrhea and nausea.  Genitourinary: Negative for dysuria and urgency.  Musculoskeletal: Positive for arthralgias (Right hip), back pain and myalgias (Lower back). Negative for gait problem, joint swelling, neck pain and neck stiffness.  Neurological: Positive for numbness. Negative for dizziness and weakness.       Objective:     Today's Vitals   09/05/19 0844  BP: 138/74  Pulse: 75  Temp: (!) 97.4 F (36.3 C)  SpO2: 99%  Weight: 155 lb (70.3 kg)  Height: 5\' 2"  (1.575 m)   Body mass index is 28.35 kg/m. Physical Exam Vitals reviewed.  Constitutional:      Appearance: Normal appearance. She is normal weight.  Cardiovascular:     Rate and Rhythm: Normal rate and regular rhythm.     Pulses: Normal pulses.     Heart sounds: Normal heart sounds.  Pulmonary:     Effort: Pulmonary effort is normal. No respiratory distress.     Breath sounds: Normal breath sounds.  Abdominal:     General: Abdomen is flat. Bowel sounds are normal.     Palpations: Abdomen is soft.     Tenderness: There is no abdominal tenderness.  Musculoskeletal:        General: No swelling, tenderness or deformity. Normal range of motion.     Right lower leg: No edema.     Left lower leg: No edema.  Neurological:     Mental Status: She is alert and oriented to person, place, and time.  Psychiatric:        Mood and Affect: Mood normal.        Behavior: Behavior normal.    Wt Readings from Last 3  Encounters:  04/17/19 157 lb (71.2 kg)  01/04/19 156 lb 3.2 oz (70.9 kg)  10/25/18 158 lb (71.7 kg)   Health Maintenance Due  Topic Date Due  . Hepatitis C Screening  Never done  . HIV Screening  Never done  .  TETANUS/TDAP  Never done  . PAP SMEAR-Modifier  07/08/2019   Lab Results  Component Value Date   NA 138 11/17/2018   K 4.7 11/17/2018   CO2 23 11/17/2018   GLUCOSE 92 11/17/2018   BUN 10 11/17/2018   CREATININE 0.68 11/17/2018   CALCIUM 8.8 11/17/2018     Assessment & Plan:  1. Lumbar back pain with radiculopathy affecting right lower extremity Robaxin-rx-risk/benefit/side effects d/w pt Aleve-220mg -take 2 BID with food If no improvement or worsening symptoms, follow up appointment for possible PT referral to re-establish ROM Follow-up: as needed An After Visit Summary was printed and given to the patient.  Horald Pollen [facility] [phone]

## 2019-09-05 ENCOUNTER — Other Ambulatory Visit: Payer: Self-pay

## 2019-09-05 ENCOUNTER — Ambulatory Visit (INDEPENDENT_AMBULATORY_CARE_PROVIDER_SITE_OTHER): Payer: Managed Care, Other (non HMO) | Admitting: Family Medicine

## 2019-09-05 VITALS — BP 138/74 | HR 75 | Temp 97.4°F | Ht 62.0 in | Wt 155.0 lb

## 2019-09-05 DIAGNOSIS — M5416 Radiculopathy, lumbar region: Secondary | ICD-10-CM

## 2019-09-05 MED ORDER — METHOCARBAMOL 500 MG PO TABS: ORAL_TABLET | ORAL | 1 refills | Status: DC

## 2019-09-05 NOTE — Patient Instructions (Signed)
Back Exercises These exercises help to make your trunk and back strong. They also help to keep the lower back flexible. Doing these exercises can help to prevent back pain or lessen existing pain.  If you have back pain, try to do these exercises 2-3 times each day or as told by your doctor.  As you get better, do the exercises once each day. Repeat the exercises more often as told by your doctor.  To stop back pain from coming back, do the exercises once each day, or as told by your doctor. Exercises Single knee to chest Do these steps 3-5 times in a row for each leg: 1. Lie on your back on a firm bed or the floor with your legs stretched out. 2. Bring one knee to your chest. 3. Grab your knee or thigh with both hands and hold them it in place. 4. Pull on your knee until you feel a gentle stretch in your lower back or buttocks. 5. Keep doing the stretch for 10-30 seconds. 6. Slowly let go of your leg and straighten it. Pelvic tilt Do these steps 5-10 times in a row: 1. Lie on your back on a firm bed or the floor with your legs stretched out. 2. Bend your knees so they point up to the ceiling. Your feet should be flat on the floor. 3. Tighten your lower belly (abdomen) muscles to press your lower back against the floor. This will make your tailbone point up to the ceiling instead of pointing down to your feet or the floor. 4. Stay in this position for 5-10 seconds while you gently tighten your muscles and breathe evenly. Cat-cow Do these steps until your lower back bends more easily: 1. Get on your hands and knees on a firm surface. Keep your hands under your shoulders, and keep your knees under your hips. You may put padding under your knees. 2. Let your head hang down toward your chest. Tighten (contract) the muscles in your belly. Point your tailbone toward the floor so your lower back becomes rounded like the back of a cat. 3. Stay in this position for 5 seconds. 4. Slowly lift your  head. Let the muscles of your belly relax. Point your tailbone up toward the ceiling so your back forms a sagging arch like the back of a cow. 5. Stay in this position for 5 seconds.  Press-ups Do these steps 5-10 times in a row: 1. Lie on your belly (face-down) on the floor. 2. Place your hands near your head, about shoulder-width apart. 3. While you keep your back relaxed and keep your hips on the floor, slowly straighten your arms to raise the top half of your body and lift your shoulders. Do not use your back muscles. You may change where you place your hands in order to make yourself more comfortable. 4. Stay in this position for 5 seconds. 5. Slowly return to lying flat on the floor.  Bridges Do these steps 10 times in a row: 1. Lie on your back on a firm surface. 2. Bend your knees so they point up to the ceiling. Your feet should be flat on the floor. Your arms should be flat at your sides, next to your body. 3. Tighten your butt muscles and lift your butt off the floor until your waist is almost as high as your knees. If you do not feel the muscles working in your butt and the back of your thighs, slide your feet 1-2 inches  farther away from your butt. 4. Stay in this position for 3-5 seconds. 5. Slowly lower your butt to the floor, and let your butt muscles relax. If this exercise is too easy, try doing it with your arms crossed over your chest. Belly crunches Do these steps 5-10 times in a row: 1. Lie on your back on a firm bed or the floor with your legs stretched out. 2. Bend your knees so they point up to the ceiling. Your feet should be flat on the floor. 3. Cross your arms over your chest. 4. Tip your chin a little bit toward your chest but do not bend your neck. 5. Tighten your belly muscles and slowly raise your chest just enough to lift your shoulder blades a tiny bit off of the floor. Avoid raising your body higher than that, because it can put too much stress on your low  back. 6. Slowly lower your chest and your head to the floor. Back lifts Do these steps 5-10 times in a row: 1. Lie on your belly (face-down) with your arms at your sides, and rest your forehead on the floor. 2. Tighten the muscles in your legs and your butt. 3. Slowly lift your chest off of the floor while you keep your hips on the floor. Keep the back of your head in line with the curve in your back. Look at the floor while you do this. 4. Stay in this position for 3-5 seconds. 5. Slowly lower your chest and your face to the floor. Contact a doctor if:  Your back pain gets a lot worse when you do an exercise.  Your back pain does not get better 2 hours after you exercise. If you have any of these problems, stop doing the exercises. Do not do them again unless your doctor says it is okay. Get help right away if:  You have sudden, very bad back pain. If this happens, stop doing the exercises. Do not do them again unless your doctor says it is okay. This information is not intended to replace advice given to you by your health care provider. Make sure you discuss any questions you have with your health care provider. Document Revised: 11/18/2017 Document Reviewed: 11/18/2017 Elsevier Patient Education  [place] Injury Prevention Back injuries can be very painful. They can also be difficult to heal. After having one back injury, you are more likely to have another one again. It is important to learn how to avoid injuring or re-injuring your back. The following tips can help you to prevent a back injury. What actions can I take to prevent back injuries? Nutrition changes Talk with your health care provider about your overall diet, and especially about foods that strengthen your bones.  Ask your health care provider how much calcium and vitamin D you need each day. These nutrients help to prevent weakening of the bones (osteoporosis). Osteoporosis can cause broken (fractured)  bones, which lead to back pain.  Eat foods that are good sources of calcium. These include dairy products, green leafy vegetables, and products that have had calcium added to them (fortified).  Eat foods that are good sources of vitamin D. These include milk and foods that are fortified with vitamin D.  If needed, take supplements and vitamins as directed by your health care provider. Physical fitness Physical fitness strengthens your bones and your muscles. It also increases your balance and strength.  Exercise for 30 minutes per day on most days of  the week, or as directed by your health care provider. Make sure to: ? Do aerobic exercises, such as walking, jogging, biking, or swimming. ? Do exercises that increase balance and strength, such as tai chi and yoga. These can decrease your risk of falling and injuring your back. ? Do stretching exercises to help with flexibility. ? Develop strong abdominal muscles. Your abdominal muscles provide a lot of the support that your back needs.  Maintain a healthy weight. This helps to decrease your risk of a back injury. Good posture        Prevent back injuries by developing and maintaining a good posture. To do this successfully:  Sit up and stand up straight. Avoid leaning forward when you sit or hunching over when you stand.  Choose chairs that have good low-back (lumbar) support.  If you work at a desk, sit close to it so you do not need to lean over. Keep your chin tucked in. Keep your neck drawn back, and keep your elbows bent at a right angle.  Sit high and close to the steering wheel when you drive. Add a lumbar support to your car seat, if needed.  Avoid sitting or standing in one position for very long. Take breaks to get up, stretch, and walk around at least one time every hour. Take breaks every hour if you are driving for long periods of time.  Sleep on your side with your knees slightly bent, or sleep on your back with a  pillow under your knees.  Lifting, twisting, and reaching Back injuries are more likely to occur when carrying loads and twisting at the same time. When you bend and lift, or reach for items that are high up in shelves, use positions that put less stress on your back.  Heavy lifting ? Avoid heavy lifting, especially the kind of heavy lifting that is repetitive. If you must do heavy lifting:  Stretch before lifting.  Work slowly.  Rest between lifts.  Use a tool such as a cart or a dolly to move objects.  Make several small trips instead of carrying one heavy load.  Ask for help when you need it, especially when moving big or heavy objects. ? Follow these steps when lifting:  Stand with your feet shoulder-width apart.  Get as close to the object as you can. Do not try to pick up a heavy object that is far from your body.  Use handles or lifting straps if they are available.  Bend at your knees. Squat down, but keep your heels off the floor.  Keep your shoulders pulled back, your chin tucked in, and your back straight.  Lift the object slowly while you tighten the muscles in your legs, abdomen, and buttocks. Keep the object as close to the center of your body as possible. ? Follow these steps when putting down a heavy load:  Stand with your feet shoulder-width apart.  Lower the object slowly while you tighten the muscles in your legs, abdomen, and buttocks. Keep the object as close to the center of your body as possible.  Keep your shoulders pulled back, your chin tucked in, and your back straight.  Bend at your knees. Squat down, but keep your heels off the floor.  Use handles or lifting straps if they are available.  Twisting and reaching ? Avoid lifting heavy objects above your waist. ? Do not twist at your waist while you are lifting or carrying a load. If you need to  turn, move your feet. ? Do not bend over without bending at your knees. ? Avoid reaching over your  head, across a table, or for an object on a high surface.  Other changes   Avoid wet floors and icy ground. Keep sidewalks clear of ice to prevent falls.  Do not sleep on a mattress that is too soft or too hard.  Put heavier objects on shelves at waist level, and put lighter objects on lower or higher shelves.  Find ways to decrease your stress, such as by exercising, getting a massage, or practicing relaxation techniques. Stress can build up in your muscles. Tense muscles are more vulnerable to injury.  Talk with your health care provider if you feel anxious or depressed. These conditions can make back pain worse.  Wear flat heel shoes with cushioned soles.  Use both shoulder straps when carrying a backpack.  Do not use any products that contain nicotine or tobacco, such as cigarettes and e-cigarettes. If you need help quitting, ask your health care provider. Summary  Back injuries can be very painful and difficult to heal.  You can prevent injuring or re-injuring your back by making nutrition changes, working on being physically fit, developing a good posture, and lifting heavy objects in a safe way.  Making other changes can also help to prevent back injuries. These include eating a healthy diet, exercising regularly and maintaining a healthy weight. This information is not intended to replace advice given to you by your health care provider. Make sure you discuss any questions you have with your health care provider. Document Revised: 11/16/2018 Document Reviewed: 04/10/2017 Elsevier Patient Education  2020 [employer].

## 2019-10-03 ENCOUNTER — Other Ambulatory Visit: Payer: Self-pay

## 2019-10-03 ENCOUNTER — Ambulatory Visit (INDEPENDENT_AMBULATORY_CARE_PROVIDER_SITE_OTHER): Payer: Managed Care, Other (non HMO) | Admitting: Family Medicine

## 2019-10-03 ENCOUNTER — Encounter: Payer: Self-pay | Admitting: Family Medicine

## 2019-10-03 VITALS — BP 124/80 | HR 69 | Temp 97.6°F | Ht 62.0 in | Wt 155.0 lb

## 2019-10-03 DIAGNOSIS — Z1231 Encounter for screening mammogram for malignant neoplasm of breast: Secondary | ICD-10-CM | POA: Diagnosis not present

## 2019-10-03 DIAGNOSIS — Z6828 Body mass index (BMI) 28.0-28.9, adult: Secondary | ICD-10-CM

## 2019-10-03 DIAGNOSIS — Z0000 Encounter for general adult medical examination without abnormal findings: Secondary | ICD-10-CM

## 2019-10-03 NOTE — Progress Notes (Signed)
Subjective:  Patient ID: Julie Thornton, female    DOB: 05/05/1960  Age: 59 y.o. MRN: [id number]  Chief Complaint  Patient presents with  . Annual Exam    HPI Well Adult Physical: Patient here for a comprehensive physical exam. Do you take any herbs or supplements that were not prescribed by a doctor? Yes, probiotic Are you taking calcium supplements? sometimes Are you taking aspirin daily? yes  Encounter for general adult medical examination without abnormal findings  Physical ("At Risk" items are starred): Patient's last physical exam was 1 year ago .  Weight: Appropriate for height (BMI less than 27%) ;  Blood Pressure: Normal (BP less than 120/80) ;  Medical History: Patient history reviewed ; Family history reviewed ;  Allergies Reviewed: No change in current allergies ;  Medications Reviewed: Medications reviewed - no changes ;  Lipids: Normal lipid levels ;  Smoking: Life-long non-smoker ;  Physical Activity: Exercises at least 3 times per week does yard work regularly  Alcohol/Drug Use: Is a non-drinker ; No illicit drug use ;  Patient is not afflicted from Stress Incontinence and Urge Incontinence  Safety: reviewed ; Patient wears a seat belt, has smoke detectors, has carbon monoxide detectors, practices appropriate gun safety, and wears sunscreen with extended sun exposure. Dental Care: biannual cleanings, brushes and flosses daily. Ophthalmology/Optometry: Annual visit.  Hearing loss: none Vision impairments: wears glasses   Menstrual History:  Pregnancy history: no Safe at home: yes Self breast exams: yes  Last mammogram 12/2017    Office Visit from 10/03/2019 in [facility]  PHQ-2 Total Score 0    last colonoscopy was in 2013       Social Hx   Social History   Socioeconomic History  . Marital status: Married    Spouse name: Not on file  . Number of children: Not on file  . Years of education: Not on file  . Highest education level: Not on file    Occupational History  . Not on file  Tobacco Use  . Smoking status: Never Smoker  . Smokeless tobacco: Never Used  Vaping Use  . Vaping Use: Never used  Substance and Sexual Activity  . Alcohol use: Yes    Alcohol/week: 0.0 standard drinks    Comment: occ-rare  . Drug use: Never  . Sexual activity: Not on file  Other Topics Concern  . Not on file  Social History Narrative  . Not on file   Social Determinants of Health   Financial Resource Strain:   . Difficulty of Paying Living Expenses:   Food Insecurity:   . Worried About Programme researcher, broadcasting/film/video in the Last Year:   . Barista in the Last Year:   Transportation Needs:   . Freight forwarder (Medical):   Marland Kitchen Lack of Transportation (Non-Medical):   Physical Activity:   . Days of Exercise per Week:   . Minutes of Exercise per Session:   Stress:   . Feeling of Stress :   Social Connections:   . Frequency of Communication with Friends and Family:   . Frequency of Social Gatherings with Friends and Family:   . Attends Religious Services:   . Active Member of Clubs or Organizations:   . Attends Banker Meetings:   Marland Kitchen Marital Status:    Past Medical History:  Diagnosis Date  . Allergy   . Arthritis   . Dysmenorrhea   . GERD (gastroesophageal reflux disease)   .  Migraine   . Ovarian cyst   . TMJ (dislocation of temporomandibular joint)    Past Surgical History:  Procedure Laterality Date  . BACK SURGERY  2018  . CHOLECYSTECTOMY    . ENDOMETRIAL ABLATION  09/25/2011  . KNEE SURGERY      Family History  Problem Relation Age of Onset  . Hypertension Mother   . Fibroids Mother   . Kidney failure Mother   . Chronic Renal Failure Mother   . Arrhythmia Mother        A-Fib  . Hypertension Father   . Congestive Heart Failure Father   . Arrhythmia Father   . Ovarian cancer Maternal Grandmother   . Osteoarthritis Other     Review of Systems  Constitutional: Negative for chills, fatigue and  fever.  HENT: Negative for congestion, ear pain, rhinorrhea and sore throat.   Respiratory: Negative for cough and shortness of breath.   Cardiovascular: Negative for chest pain.  Gastrointestinal: Negative for abdominal pain, constipation, diarrhea, nausea and vomiting.  Genitourinary: Negative for dysuria and urgency.  Musculoskeletal: Negative for back pain and myalgias.  Neurological: Negative for dizziness, weakness, light-headedness and headaches.  Psychiatric/Behavioral: Negative for dysphoric mood. The patient is not nervous/anxious.      Objective:  BP 124/80   Pulse 69   Temp 97.6 F (36.4 C)   Ht 5\' 2"  (1.575 m)   Wt 155 lb (70.3 kg)   SpO2 100%   BMI 28.35 kg/m   BP/Weight 10/03/2019 09/05/2019 06/13/2019  Systolic BP 124 138 110  Diastolic BP 80 74 71  Wt. (Lbs) 155 155 -  BMI 28.35 28.35 -    Physical Exam Vitals reviewed.  Constitutional:      Appearance: Normal appearance. She is normal weight.  Cardiovascular:     Rate and Rhythm: Normal rate and regular rhythm.     Pulses: Normal pulses.     Heart sounds: Normal heart sounds.  Pulmonary:     Effort: Pulmonary effort is normal. No respiratory distress.     Breath sounds: Normal breath sounds.  Abdominal:     General: Abdomen is flat. Bowel sounds are normal.     Palpations: Abdomen is soft.     Tenderness: There is no abdominal tenderness.  Skin:    General: Skin is warm.  Neurological:     Mental Status: She is alert and oriented to person, place, and time.  Psychiatric:        Mood and Affect: Mood normal.        Behavior: Behavior normal.     Lab Results  Component Value Date   GLUCOSE 92 11/17/2018   NA 138 11/17/2018   K 4.7 11/17/2018   CL 105 11/17/2018   CREATININE 0.68 11/17/2018   BUN 10 11/17/2018   CO2 23 11/17/2018      Assessment & Plan:  1. Routine medical exam The current medical regimen is effective;  continue present plan and medications. Recommend continue to work  on eating healthy diet and exercise. - CBC with Differential/Platelet - Comprehensive metabolic panel - Lipid panel - TSH  2. Encounter for screening mammogram for malignant neoplasm of breast - MM DIGITAL SCREENING BILATERAL  3. BMI 28.0-28.9,adult  Recommend continue to work on eating healthy diet and exercise.  These are the goals we discussed: Goals   Increase exercise to 5 times a week.      This is a list of the screening recommended for you and due dates:  Health Maintenance  Topic Date Due  .  Hepatitis C: One time screening is recommended by Center for Disease Control  (CDC) for  adults born from [birth year] through 1963.   Never done  . HIV Screening  Never done  . Tetanus Vaccine  Never done  . Pap Smear  07/08/2019  . Flu Shot  10/08/2019  . Mammogram  01/19/2020  . Colon Cancer Screening  06/12/2022  . COVID-19 Vaccine  Completed     AN INDIVIDUALIZED CARE PLAN: was established or reinforced today.   SELF MANAGEMENT: The patient and I together assessed ways to personally work towards obtaining the recommended goals  Support needs The patient and/or family needs were assessed and services were offered if appropriate.  Follow-up: Return in about 3 months (around 01/03/2020).  An After Visit Summary was printed and given to the patient.  Blane Ohara [facility] [phone]

## 2019-10-03 NOTE — Patient Instructions (Addendum)
Check with insurance to see if Shingrix vaccine is covered Set up a Health Care Power of Attorney and Living Will   Preventive Care 57-57 Years Old, Female Preventive care refers to visits with your health care provider and lifestyle choices that can promote health and wellness. This includes:  A yearly physical exam. This may also be called an annual well check.  Regular dental visits and eye exams.  Immunizations.  Screening for certain conditions.  Healthy lifestyle choices, such as eating a healthy diet, getting regular exercise, not using drugs or products that contain nicotine and tobacco, and limiting alcohol use. What can I expect for my preventive care visit? Physical exam Your health care provider will check your:  Height and weight. This may be used to calculate body mass index (BMI), which tells if you are at a healthy weight.  Heart rate and blood pressure.  Skin for abnormal spots. Counseling Your health care provider may ask you questions about your:  Alcohol, tobacco, and drug use.  Emotional well-being.  Home and relationship well-being.  Sexual activity.  Eating habits.  Work and work Statistician.  Method of birth control.  Menstrual cycle.  Pregnancy history. What immunizations do I need?  Influenza (flu) vaccine  This is recommended every year. Tetanus, diphtheria, and pertussis (Tdap) vaccine  You may need a Td booster every 10 years. Varicella (chickenpox) vaccine  You may need this if you have not been vaccinated. Zoster (shingles) vaccine  You may need this after age 54. Measles, mumps, and rubella (MMR) vaccine  You may need at least one dose of MMR if you were born in 1957 or later. You may also need a second dose. Pneumococcal conjugate (PCV13) vaccine  You may need this if you have certain conditions and were not previously vaccinated. Pneumococcal polysaccharide (PPSV23) vaccine  You may need one or two doses if you smoke  cigarettes or if you have certain conditions. Meningococcal conjugate (MenACWY) vaccine  You may need this if you have certain conditions. Hepatitis A vaccine  You may need this if you have certain conditions or if you travel or work in places where you may be exposed to hepatitis A. Hepatitis B vaccine  You may need this if you have certain conditions or if you travel or work in places where you may be exposed to hepatitis B. Haemophilus influenzae type b (Hib) vaccine  You may need this if you have certain conditions. Human papillomavirus (HPV) vaccine  If recommended by your health care provider, you may need three doses over 6 months. You may receive vaccines as individual doses or as more than one vaccine together in one shot (combination vaccines). Talk with your health care provider about the risks and benefits of combination vaccines. What tests do I need? Blood tests  Lipid and cholesterol levels. These may be checked every 57 years, or more frequently if you are over 62 years old.  Hepatitis C test.  Hepatitis B test. Screening  Lung cancer screening. You may have this screening every year starting at age 57 if you have a 30-pack-year history of smoking and currently smoke or have quit within the past 15 years.  Colorectal cancer screening. All adults should have this screening starting at age 57 and continuing until age 34. Your health care provider may recommend screening at age 57 if you are at increased risk. You will have tests every 57 years, depending on your results and the type of screening test.  Diabetes  screening. This is done by checking your blood sugar (glucose) after you have not eaten for a while (fasting). You may have this done every 1-3 years.  Mammogram. This may be done every 1-2 years. Talk with your health care provider about when you should start having regular mammograms. This may depend on whether you have a family history of breast  cancer.  BRCA-related cancer screening. This may be done if you have a family history of breast, ovarian, tubal, or peritoneal cancers.  Pelvic exam and Pap test. This may be done every 3 years starting at age 62. Starting at age 36, this may be done every 5 years if you have a Pap test in combination with an HPV test. Other tests  Sexually transmitted disease (STD) testing.  Bone density scan. This is done to screen for osteoporosis. You may have this scan if you are at high risk for osteoporosis. Follow these instructions at home: Eating and drinking  Eat a diet that includes fresh fruits and vegetables, whole grains, lean protein, and low-fat dairy.  Take vitamin and mineral supplements as recommended by your health care provider.  Do not drink alcohol if: ? Your health care provider tells you not to drink. ? You are pregnant, may be pregnant, or are planning to become pregnant.  If you drink alcohol: ? Limit how much you have to 0-1 drink a day. ? Be aware of how much alcohol is in your drink. In the [place] one drink equals one 12 oz bottle of beer (355 mL), one 5 oz glass of wine (148 mL), or one 1 oz glass of hard liquor (44 mL). Lifestyle  Take daily care of your teeth and gums.  Stay active. Exercise for at least 30 minutes on 5 or more days each week.  Do not use any products that contain nicotine or tobacco, such as cigarettes, e-cigarettes, and chewing tobacco. If you need help quitting, ask your health care provider.  If you are sexually active, practice safe sex. Use a condom or other form of birth control (contraception) in order to prevent pregnancy and STIs (sexually transmitted infections).  If told by your health care provider, take low-dose aspirin daily starting at age 57. What's next?  Visit your health care provider once a year for a well check visit.  Ask your health care provider how often you should have your eyes and teeth checked.  Stay up to date  on all vaccines. This information is not intended to replace advice given to you by your health care provider. Make sure you discuss any questions you have with your health care provider. Document Revised: 11/04/2017 Document Reviewed: 11/04/2017 Elsevier Patient Education  2020 [employer].

## 2019-10-04 LAB — COMPREHENSIVE METABOLIC PANEL
ALT: 7 IU/L (ref 0–32)
AST: 11 IU/L (ref 0–40)
Albumin/Globulin Ratio: 1.8 (ref 1.2–2.2)
Albumin: 3.9 g/dL (ref 3.8–4.9)
Alkaline Phosphatase: 138 IU/L — ABNORMAL HIGH (ref 48–121)
BUN/Creatinine Ratio: 13 (ref 9–23)
BUN: 9 mg/dL (ref 6–24)
Bilirubin Total: 0.3 mg/dL (ref 0.0–1.2)
CO2: 21 mmol/L (ref 20–29)
Calcium: 9.1 mg/dL (ref 8.7–10.2)
Chloride: 105 mmol/L (ref 96–106)
Creatinine, Ser: 0.71 mg/dL (ref 0.57–1.00)
GFR calc Af Amer: 109 mL/min/{1.73_m2} (ref >59)
GFR calc non Af Amer: 95 mL/min/{1.73_m2} (ref >59)
Globulin, Total: 2.2 g/dL (ref 1.5–4.5)
Glucose: 93 mg/dL (ref 65–99)
Potassium: 4.8 mmol/L (ref 3.5–5.2)
Sodium: 139 mmol/L (ref 134–144)
Total Protein: 6.1 g/dL (ref 6.0–8.5)

## 2019-10-04 LAB — CBC WITH DIFFERENTIAL/PLATELET
Basophils Absolute: 0 10*3/uL (ref 0.0–0.2)
Basos: 1 %
EOS (ABSOLUTE): 0.2 10*3/uL (ref 0.0–0.4)
Eos: 3 %
Hematocrit: 40 % (ref 34.0–46.6)
Hemoglobin: 13.1 g/dL (ref 11.1–15.9)
Immature Grans (Abs): 0 10*3/uL (ref 0.0–0.1)
Immature Granulocytes: 0 %
Lymphocytes Absolute: 1.5 10*3/uL (ref 0.7–3.1)
Lymphs: 23 %
MCH: 26.6 pg (ref 26.6–33.0)
MCHC: 32.8 g/dL (ref 31.5–35.7)
MCV: 81 fL (ref 79–97)
Monocytes Absolute: 0.4 10*3/uL (ref 0.1–0.9)
Monocytes: 6 %
Neutrophils Absolute: 4.3 10*3/uL (ref 1.4–7.0)
Neutrophils: 67 %
Platelets: 267 10*3/uL (ref 150–450)
RBC: 4.92 x10E6/uL (ref 3.77–5.28)
RDW: 12.1 % (ref 11.7–15.4)
WBC: 6.5 10*3/uL (ref 3.4–10.8)

## 2019-10-04 LAB — LIPID PANEL
Chol/HDL Ratio: 2.8 ratio (ref 0.0–4.4)
Cholesterol, Total: 170 mg/dL (ref 100–199)
HDL: 60 mg/dL (ref >39)
LDL Chol Calc (NIH): 93 mg/dL (ref 0–99)
Triglycerides: 92 mg/dL (ref 0–149)
VLDL Cholesterol Cal: 17 mg/dL (ref 5–40)

## 2019-10-04 LAB — CARDIOVASCULAR RISK ASSESSMENT

## 2019-10-04 LAB — TSH: TSH: 2.31 u[IU]/mL (ref 0.450–4.500)

## 2019-10-31 ENCOUNTER — Other Ambulatory Visit: Payer: Self-pay | Admitting: Physician Assistant

## 2019-11-16 ENCOUNTER — Other Ambulatory Visit: Payer: Self-pay | Admitting: Physician Assistant

## 2020-01-28 ENCOUNTER — Other Ambulatory Visit: Payer: Self-pay | Admitting: Family Medicine

## 2020-03-27 ENCOUNTER — Telehealth (INDEPENDENT_AMBULATORY_CARE_PROVIDER_SITE_OTHER): Payer: Managed Care, Other (non HMO) | Admitting: Family Medicine

## 2020-03-27 ENCOUNTER — Other Ambulatory Visit: Payer: Self-pay | Admitting: Family Medicine

## 2020-03-27 VITALS — Temp 99.7°F

## 2020-03-27 DIAGNOSIS — J018 Other acute sinusitis: Secondary | ICD-10-CM

## 2020-03-27 DIAGNOSIS — U071 COVID-19: Secondary | ICD-10-CM

## 2020-03-27 DIAGNOSIS — R509 Fever, unspecified: Secondary | ICD-10-CM | POA: Diagnosis not present

## 2020-03-27 LAB — POC COVID19 BINAXNOW: SARS Coronavirus 2 Ag: NEGATIVE

## 2020-03-27 LAB — POCT INFLUENZA A/B
Influenza A, POC: NEGATIVE
Influenza B, POC: NEGATIVE

## 2020-03-27 MED ORDER — CEFDINIR 300 MG PO CAPS: 300.0000 mg | ORAL_CAPSULE | Freq: Two times a day (BID) | ORAL | 0 refills | Status: DC

## 2020-03-27 NOTE — Progress Notes (Signed)
Virtual Visit via Telephone Note   This visit type was conducted due to national recommendations for restrictions regarding the COVID-19 Pandemic (e.g. social distancing) in an effort to limit this patient's exposure and mitigate transmission in our community.  Due to her co-morbid illnesses, this patient is at least at moderate risk for complications without adequate follow up.  This format is felt to be most appropriate for this patient at this time.  The patient did not have access to video technology/had technical difficulties with video requiring transitioning to audio format only (telephone).  All issues noted in this document were discussed and addressed.  No physical exam could be performed with this format.  Patient verbally consented to a telehealth visit.   Date:  03/27/2020   ID:  Julie Thornton, DOB Jun 18, 1960, MRN [id number]  Patient Location: Home Provider Location: Office/Clinic  PCP:  Blane Ohara, MD   Evaluation Performed: acute visit  Chief Complaint:  malaise  History of Present Illness:    Julie Thornton is a 60 y.o. female presents virtually complaining of malaise since yesterday.  Nasal congestion, cough, earaches, and headaches.  She denies shortness of breath or any gastrointestinal symptoms.  She has maintained her sense of smell and taste.  She ran a home COVID test that was positive.  Patient has been vaccinated x3.  She also has had her flu shot.  The patient does have symptoms concerning for COVID-19 infection (fever, chills, cough, or new shortness of breath).    Past Medical History:  Diagnosis Date  . Allergy   . Arthritis   . Dysmenorrhea   . GERD (gastroesophageal reflux disease)   . Migraine   . Ovarian cyst   . TMJ (dislocation of temporomandibular joint)     Past Surgical History:  Procedure Laterality Date  . BACK SURGERY  2018  . CHOLECYSTECTOMY    . ENDOMETRIAL ABLATION  09/25/2011  . KNEE SURGERY      Family History  Problem Relation  Age of Onset  . Hypertension Mother   . Fibroids Mother   . Kidney failure Mother   . Chronic Renal Failure Mother   . Arrhythmia Mother        A-Fib  . Hypertension Father   . Congestive Heart Failure Father   . Arrhythmia Father   . Ovarian cancer Maternal Grandmother   . Osteoarthritis Other     Social History   Socioeconomic History  . Marital status: Married    Spouse name: Not on file  . Number of children: Not on file  . Years of education: Not on file  . Highest education level: Not on file  Occupational History  . Not on file  Tobacco Use  . Smoking status: Never Smoker  . Smokeless tobacco: Never Used  Vaping Use  . Vaping Use: Never used  Substance and Sexual Activity  . Alcohol use: Yes    Alcohol/week: 0.0 standard drinks    Comment: occ-rare  . Drug use: Never  . Sexual activity: Not on file  Other Topics Concern  . Not on file  Social History Narrative  . Not on file   Social Determinants of Health   Financial Resource Strain: Not on file  Food Insecurity: Not on file  Transportation Needs: Not on file  Physical Activity: Not on file  Stress: Not on file  Social Connections: Not on file  Intimate Partner Violence: Not on file    Outpatient Medications Prior to Visit  Medication Sig  Dispense Refill  . aspirin EC 81 MG tablet Take 1 tablet (81 mg total) by mouth daily. 90 tablet 3  . cetirizine (ZYRTEC) 5 MG tablet Take 10 mg by mouth daily as needed.     Marland Kitchen estradiol (ESTRACE) 2 MG tablet Take 1 tablet by mouth once daily 90 tablet 0  . Famotidine (PEPCID AC MAXIMUM STRENGTH) 20 MG CHEW Chew 1 each by mouth daily as needed.    . fluticasone (FLONASE) 50 MCG/ACT nasal spray Place 1 spray into both nostrils daily as needed.     Marland Kitchen lisinopril (ZESTRIL) 10 MG tablet Take 1 tablet by mouth once daily 90 tablet 0  . medroxyPROGESTERone (PROVERA) 2.5 MG tablet Take 1 tablet by mouth once daily 90 tablet 1  . methocarbamol (ROBAXIN) 500 MG tablet Take  1/2-1 po qhs 30 tablet 1  . Multiple Vitamins-Minerals (THERA-M) TABS Take 1 tablet by mouth daily.      No facility-administered medications prior to visit.    Allergies:   Erythromycin and Penicillins   Social History   Tobacco Use  . Smoking status: Never Smoker  . Smokeless tobacco: Never Used  Vaping Use  . Vaping Use: Never used  Substance Use Topics  . Alcohol use: Yes    Alcohol/week: 0.0 standard drinks    Comment: occ-rare  . Drug use: Never     Review of Systems  Constitutional: Positive for malaise/fatigue. Negative for chills and fever.  HENT: Positive for ear pain and sore throat. Negative for sinus pain.   Respiratory: Positive for cough. Negative for shortness of breath.   Cardiovascular: Negative for chest pain.  Musculoskeletal: Negative for myalgias.     Labs/Other Tests and Data Reviewed:    Recent Labs: 10/03/2019: ALT 7; BUN 9; Creatinine, Ser 0.71; Hemoglobin 13.1; Platelets 267; Potassium 4.8; Sodium 139; TSH 2.310   Recent Lipid Panel Lab Results  Component Value Date/Time   CHOL 170 10/03/2019 09:58 AM   TRIG 92 10/03/2019 09:58 AM   HDL 60 10/03/2019 09:58 AM   CHOLHDL 2.8 10/03/2019 09:58 AM   LDLCALC 93 10/03/2019 09:58 AM    Wt Readings from Last 3 Encounters:  10/03/19 155 lb (70.3 kg)  09/05/19 155 lb (70.3 kg)  04/17/19 157 lb (71.2 kg)     Objective:    Vital Signs:  Temp 99.7 F (37.6 C)    Physical Exam  Patient does not sound winded. ASSESSMENT & PLAN:   1. Acute non-recurrent sinusitis of other sinus Cefdinir rx sent. - Influenza A/B negative  2. Fever, unspecified fever cause - Novel Coronavirus, NAA ([facility]) - POC COVID-19 BinaxNow negative.   Recommended rest, fluids , mucinex, and tylenol.  If positive patient is to quarantine for 5 days and then may end quarantine as well as she continues to wear a mask.  Orders Placed This Encounter  Procedures  . Novel Coronavirus, NAA ([facility])  . POC COVID-19  BinaxNow  . Influenza A/B     COVID-19 Education: Given  I spent 10 minutes dedicated to the care of this patient on the date of this encounter.Follow Up:  Virtual Visit  prn  Signed,  Blane Ohara, MD  03/27/2020 1:35 PM    [facility] [place]

## 2020-03-28 ENCOUNTER — Telehealth: Payer: Managed Care, Other (non HMO) | Admitting: Family Medicine

## 2020-03-29 LAB — NOVEL CORONAVIRUS, NAA: SARS-CoV-2, NAA: DETECTED — AB

## 2020-03-30 ENCOUNTER — Encounter: Payer: Self-pay | Admitting: Family Medicine

## 2020-04-11 ENCOUNTER — Other Ambulatory Visit: Payer: Self-pay

## 2020-04-11 ENCOUNTER — Telehealth: Payer: Self-pay

## 2020-04-11 DIAGNOSIS — Z1231 Encounter for screening mammogram for malignant neoplasm of breast: Secondary | ICD-10-CM

## 2020-04-29 ENCOUNTER — Other Ambulatory Visit: Payer: Self-pay | Admitting: Family Medicine

## 2020-05-07 ENCOUNTER — Other Ambulatory Visit: Payer: Self-pay | Admitting: Family Medicine

## 2020-05-14 ENCOUNTER — Other Ambulatory Visit: Payer: Self-pay | Admitting: Family Medicine

## 2020-06-10 ENCOUNTER — Telehealth: Payer: Self-pay

## 2020-06-10 NOTE — Telephone Encounter (Signed)
Patient notified of appointment.  Mammogram was ordered:  PLEASE arrive at 5:30 for registration Your appointment date is 06/24/2020 Your appointment time is 6:00  Order faxed on 06/07/2020

## 2020-06-26 ENCOUNTER — Other Ambulatory Visit: Payer: Self-pay

## 2020-06-26 DIAGNOSIS — R928 Other abnormal and inconclusive findings on diagnostic imaging of breast: Secondary | ICD-10-CM

## 2020-06-28 ENCOUNTER — Telehealth: Payer: Self-pay | Admitting: Family Medicine

## 2020-06-28 NOTE — Telephone Encounter (Signed)
   Julie Thornton has been scheduled for the following appointment:  WHAT: Diagnostic Mammo-left WHERE: [facility] DATE: 07/22/20 TIME: arrive at 2:50 for 3:00 appt  Patient has been made aware.

## 2020-07-08 ENCOUNTER — Ambulatory Visit (INDEPENDENT_AMBULATORY_CARE_PROVIDER_SITE_OTHER): Payer: Managed Care, Other (non HMO) | Admitting: Podiatry

## 2020-07-08 ENCOUNTER — Ambulatory Visit (INDEPENDENT_AMBULATORY_CARE_PROVIDER_SITE_OTHER): Payer: Managed Care, Other (non HMO)

## 2020-07-08 ENCOUNTER — Other Ambulatory Visit: Payer: Self-pay | Admitting: Podiatry

## 2020-07-08 ENCOUNTER — Other Ambulatory Visit: Payer: Self-pay

## 2020-07-08 DIAGNOSIS — M2142 Flat foot [pes planus] (acquired), left foot: Secondary | ICD-10-CM

## 2020-07-08 DIAGNOSIS — M7751 Other enthesopathy of right foot: Secondary | ICD-10-CM

## 2020-07-08 DIAGNOSIS — M216X2 Other acquired deformities of left foot: Secondary | ICD-10-CM | POA: Diagnosis not present

## 2020-07-08 DIAGNOSIS — M7741 Metatarsalgia, right foot: Secondary | ICD-10-CM

## 2020-07-08 DIAGNOSIS — M2141 Flat foot [pes planus] (acquired), right foot: Secondary | ICD-10-CM | POA: Diagnosis not present

## 2020-07-08 DIAGNOSIS — M25373 Other instability, unspecified ankle: Secondary | ICD-10-CM

## 2020-07-08 DIAGNOSIS — M7742 Metatarsalgia, left foot: Secondary | ICD-10-CM | POA: Diagnosis not present

## 2020-07-08 DIAGNOSIS — M7752 Other enthesopathy of left foot: Secondary | ICD-10-CM

## 2020-07-08 DIAGNOSIS — M216X1 Other acquired deformities of right foot: Secondary | ICD-10-CM

## 2020-07-10 DIAGNOSIS — M79676 Pain in unspecified toe(s): Secondary | ICD-10-CM

## 2020-07-10 NOTE — Progress Notes (Signed)
  Subjective:  Patient ID: Julie Thornton, female    DOB: 1960/07/03,  MRN: [id number]  Chief Complaint  Patient presents with  . Gait Problem    Gait problem -pt states shoes have started to bother again with dorsla pain at right foot and discomofrt abt BL ankles    60 y.o. female presents with the above complaint. History confirmed with patient.   Objective:  Physical Exam: warm, good capillary refill, no trophic changes or ulcerative lesions, normal DP and PT pulses and normal sensory exam. Left Foot: normal exam, no swelling, tenderness, instability; ligaments intact, full range of motion of all ankle/foot joints  Right Foot: normal exam, no swelling, tenderness, instability; ligaments intact, full range of motion of all ankle/foot joints   No images are attached to the encounter.  Radiographs: X-ray of both feet: no fracture, dislocation, swelling or degenerative changes noted Assessment:   1. Pes planus of both feet   2. Metatarsalgia of both feet    Plan:  Patient was evaluated and treated and all questions answered.  Metatarsalgia -Educated on etiology -We had a lengthy discussion about shoe options with the patient I think that overall her shoes are rather good and I think she would better benefit from custom orthotics.  We did cast for these today.  Return for Orthotic Pickup.

## 2020-07-19 LAB — HM COLONOSCOPY

## 2020-07-24 ENCOUNTER — Ambulatory Visit (INDEPENDENT_AMBULATORY_CARE_PROVIDER_SITE_OTHER): Payer: Managed Care, Other (non HMO) | Admitting: Podiatry

## 2020-07-24 ENCOUNTER — Encounter: Payer: Self-pay | Admitting: Family Medicine

## 2020-07-24 ENCOUNTER — Other Ambulatory Visit: Payer: Self-pay

## 2020-07-24 DIAGNOSIS — M7741 Metatarsalgia, right foot: Secondary | ICD-10-CM

## 2020-07-24 DIAGNOSIS — M2142 Flat foot [pes planus] (acquired), left foot: Secondary | ICD-10-CM

## 2020-07-24 DIAGNOSIS — M7742 Metatarsalgia, left foot: Secondary | ICD-10-CM

## 2020-07-24 DIAGNOSIS — M2141 Flat foot [pes planus] (acquired), right foot: Secondary | ICD-10-CM

## 2020-07-24 NOTE — Patient Instructions (Signed)

## 2020-07-24 NOTE — Progress Notes (Signed)
Patient presents today for orthotic pick up. Patient voices no new complaints.  Orthotics were fitted to patient's feet. No discomfort and no rubbing. Patient satisfied with the orthotics.  Orthotics were dispensed to patient with instructions for break in wear and to call the office with any concerns or questions. 

## 2020-08-16 ENCOUNTER — Other Ambulatory Visit: Payer: Self-pay | Admitting: Family Medicine

## 2020-08-27 ENCOUNTER — Telehealth: Payer: Self-pay | Admitting: Podiatry

## 2020-08-27 MED ORDER — MELOXICAM 15 MG PO TABS: 15.0000 mg | ORAL_TABLET | Freq: Every day | ORAL | 0 refills | Status: DC

## 2020-08-27 NOTE — Addendum Note (Signed)
Addended by: Ventura Sellers on: 08/27/2020 06:05 PM   Modules accepted: Orders

## 2020-08-27 NOTE — Telephone Encounter (Signed)
Tendonitis is  not getting better-worse in evening-wearing orthotics and those are good. Did not get injection at visit. Pt would like to know if you can call in anti-inflammatory to [facility] [address]

## 2020-08-28 NOTE — Telephone Encounter (Signed)
Patient called and left vm about prescription being sent to pharmacy. I did notify patient that Dr. Samuella Cota did send it to [facility].

## 2020-10-09 ENCOUNTER — Other Ambulatory Visit: Payer: Self-pay | Admitting: Podiatry

## 2020-10-09 NOTE — Telephone Encounter (Signed)
Please Advise

## 2020-11-02 IMAGING — CT CT HEART MORP W/ CTA COR W/ SCORE W/ CA W/CM &/OR W/O CM
4 of 7 series · 8 of 20 positions shown, 9 images · IV contrast (APPLIED)
Comparison: None.
COMPARISON: None.

Addendum:
EXAM:
OVER-READ INTERPRETATION  CT CHEST

The following report is an over-read performed by radiologist Dr.
Dettifoss Stingaciu [[email or web address]] on 11/22/2018. This
over-read does not include interpretation of cardiac or coronary
anatomy or pathology. The coronary calcium score/coronary CTA
interpretation by the cardiologist is attached.
CLINICAL DATA: 58 year old female with anginal symptoms.
Cardiac/Coronary  CTA
TECHNIQUE: The patient was scanned on a [employer] Force scanner.

[Series 6: best diast 75 % · axial · 0.37mm/px · z∈[-256,-213]mm · 2 of 323 slices shown, 3 images]
[im 108/323  vessel]
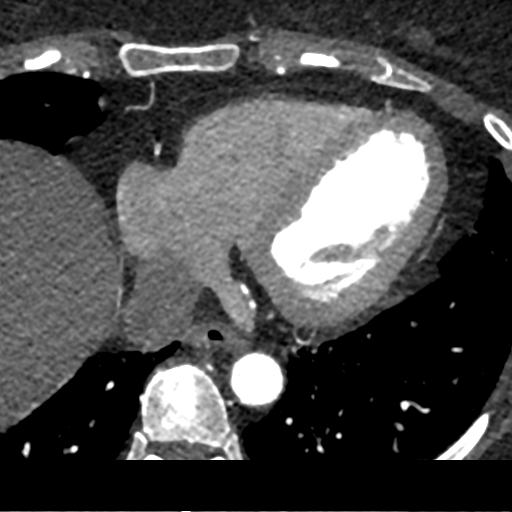
[im 108/323  lung]
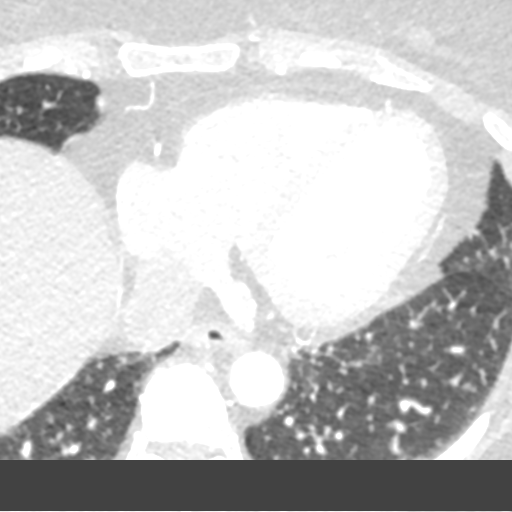
[im 215/323  vessel]
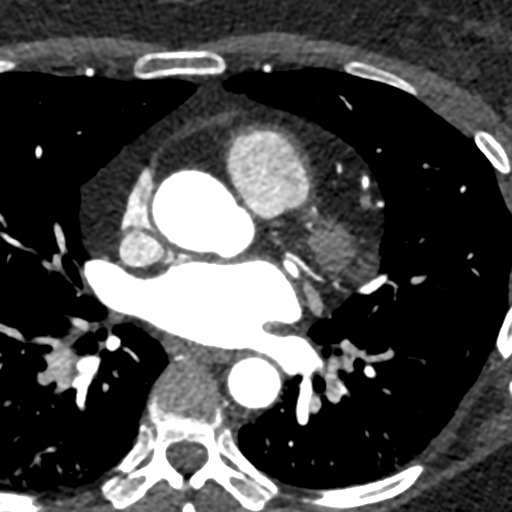

[Series 7: best syst 75 % · axial · 0.37mm/px · z∈[-256,-213]mm · 2 of 323 slices shown]
[im 108/323  vessel]
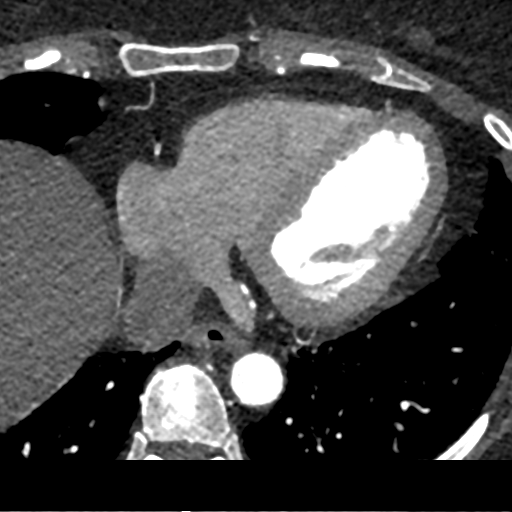
[im 215/323  vessel]
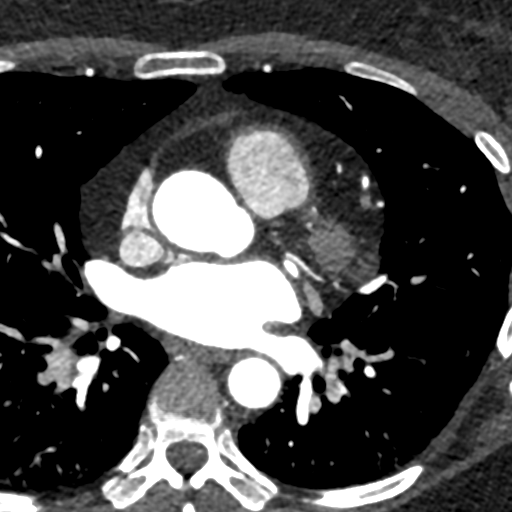

[Series 8: ts diast sharp 75 % · axial · 0.37mm/px · z∈[-256,-213]mm · 2 of 323 slices shown]
[im 108/323  lung]
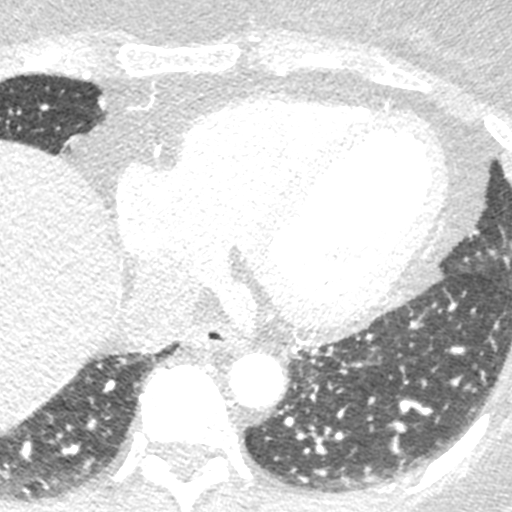
[im 215/323  lung]
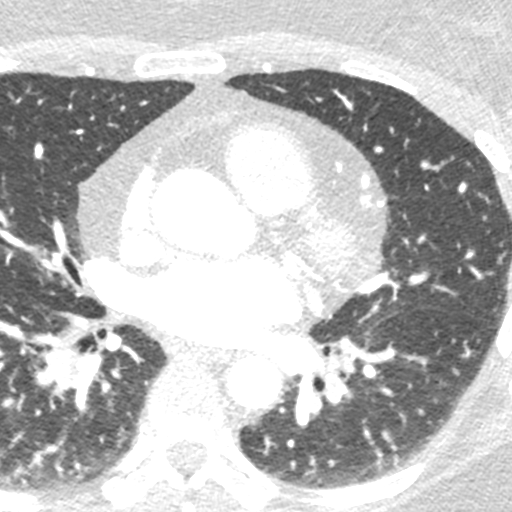

[Series 9: ts syst sharp 75 % · axial · 0.37mm/px · z∈[-256,-213]mm · 2 of 323 slices shown]
[im 108/323  lung]
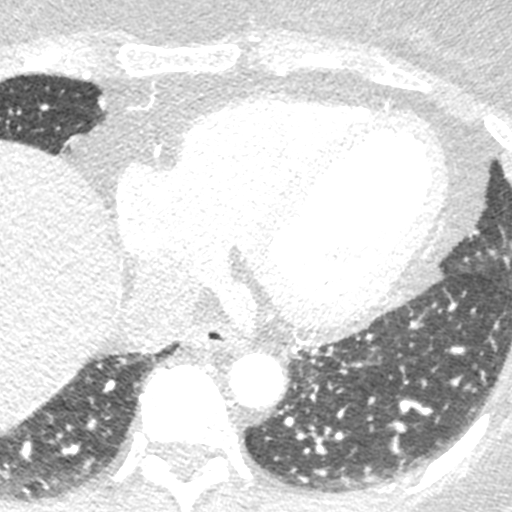
[im 215/323  lung]
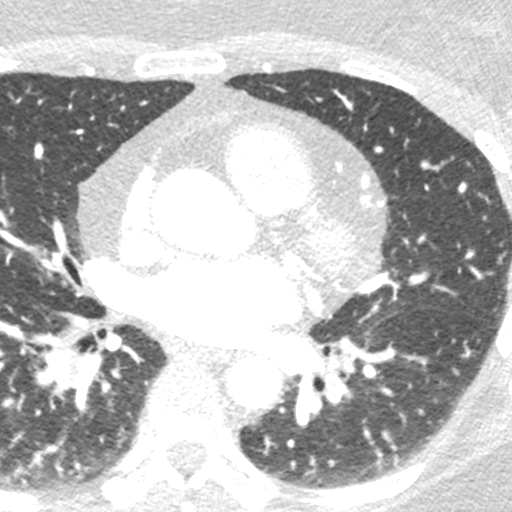

[8 of 20 positions shown; findings below may reference images not displayed]

FINDINGS: Within the visualized portions of the thorax there are no suspicious
appearing pulmonary nodules or masses, there is no acute
consolidative airspace disease, no pleural effusions, no
pneumothorax and no lymphadenopathy. Visualized portions of the
upper abdomen demonstrates postoperative changes of cholecystectomy.
There are no aggressive appearing lytic or blastic lesions noted in
the visualized portions of the skeleton.
IMPRESSION: 1. No significant incidental noncardiac findings are noted.
FINDINGS: A 100 kV prospective scan was triggered in the descending thoracic
aorta at 111 HU's. Axial non-contrast 3 mm slices were carried out
through the heart. The data set was analyzed on a dedicated work
station and scored using the Agatson method. Gantry rotation speed
was 250 msecs and collimation was .6 mm. 100 mg metoprolol, beta
blockade and 0.8 mg of sl NTG was given. The 3D data set was
reconstructed in 5% intervals of the 67-82 % of the R-R cycle.
Diastolic phases were analyzed on a dedicated work station using
MPR, MIP and VRT modes. The patient received 80 cc of contrast.

Aorta: Normal size (24.5 mm ascending, 20 mm descending). No
calcifications. No dissection.

Aortic Valve:  Trileaflet.  No calcifications.

Coronary Arteries:  Normal coronary origin.  LEFT dominance.

RCA is a small non-dominant artery.  There is no plaque.

Left main is a large artery that gives rise to LAD and LCX arteries.

LAD is a large vessel that has no plaque. 2 moderate sized diagonal
branches noted.

LCX is the dominant artery that gives rise to two OM branches and
eventually the PDA. There is no plaque.

Other findings:

Normal pulmonary vein drainage into the left atrium.

Normal left atrial appendage without a thrombus.

Normal size of the pulmonary artery.
IMPRESSION: 1. Coronary calcium score of 0. This was 0 percentile for age and
sex matched control.

2. Normal coronary origin with left dominance.

3. No evidence of CAD.

*** End of Addendum ***
EXAM:
OVER-READ INTERPRETATION  CT CHEST

The following report is an over-read performed by radiologist Dr.
Dettifoss Stingaciu [REDACTED] on 11/22/2018. This
over-read does not include interpretation of cardiac or coronary
anatomy or pathology. The coronary calcium score/coronary CTA
interpretation by the cardiologist is attached.
FINDINGS: Within the visualized portions of the thorax there are no suspicious
appearing pulmonary nodules or masses, there is no acute
consolidative airspace disease, no pleural effusions, no
pneumothorax and no lymphadenopathy. Visualized portions of the
upper abdomen demonstrates postoperative changes of cholecystectomy.
There are no aggressive appearing lytic or blastic lesions noted in
the visualized portions of the skeleton.
IMPRESSION: 1. No significant incidental noncardiac findings are noted.

## 2020-11-20 ENCOUNTER — Other Ambulatory Visit: Payer: Self-pay | Admitting: Family Medicine

## 2020-11-28 ENCOUNTER — Other Ambulatory Visit: Payer: Self-pay | Admitting: Family Medicine

## 2020-11-28 ENCOUNTER — Other Ambulatory Visit: Payer: Self-pay

## 2020-11-28 MED ORDER — LISINOPRIL 10 MG PO TABS: 10.0000 mg | ORAL_TABLET | Freq: Every day | ORAL | 0 refills | Status: DC

## 2020-11-28 MED ORDER — MEDROXYPROGESTERONE ACETATE 2.5 MG PO TABS
2.5000 mg | ORAL_TABLET | Freq: Every day | ORAL | 0 refills | Status: DC
Start: 2020-11-28 — End: 2021-03-02

## 2020-11-28 MED ORDER — ESTRADIOL 2 MG PO TABS: 2.0000 mg | ORAL_TABLET | Freq: Every day | ORAL | 0 refills | Status: DC

## 2020-11-28 NOTE — Telephone Encounter (Signed)
Pt called for refills. Pt made follow up appointment for Monday. Has not been seen for Well Visit since July of 2021.   Lorita Officer, [place] 11/28/20 1:47 PM

## 2020-12-02 ENCOUNTER — Other Ambulatory Visit: Payer: Self-pay

## 2020-12-02 ENCOUNTER — Encounter: Payer: Self-pay | Admitting: Family Medicine

## 2020-12-02 ENCOUNTER — Ambulatory Visit (INDEPENDENT_AMBULATORY_CARE_PROVIDER_SITE_OTHER): Payer: Managed Care, Other (non HMO) | Admitting: Family Medicine

## 2020-12-02 VITALS — BP 124/82 | HR 74 | Temp 97.3°F | Ht 62.0 in | Wt 157.0 lb

## 2020-12-02 DIAGNOSIS — Z23 Encounter for immunization: Secondary | ICD-10-CM

## 2020-12-02 DIAGNOSIS — I10 Essential (primary) hypertension: Secondary | ICD-10-CM | POA: Diagnosis not present

## 2020-12-02 DIAGNOSIS — R0989 Other specified symptoms and signs involving the circulatory and respiratory systems: Secondary | ICD-10-CM

## 2020-12-02 MED ORDER — METRONIDAZOLE 0.75 % EX CREA: TOPICAL_CREAM | Freq: Two times a day (BID) | CUTANEOUS | 0 refills | Status: AC

## 2020-12-02 NOTE — Patient Instructions (Signed)
No changes today.  Ordering vascular study of legs (Ankle Brachial index.) If cough worsens, please let us know.

## 2020-12-02 NOTE — Progress Notes (Signed)
Subjective:  Patient ID: Julie Thornton, female    DOB: Dec 01, 1960  Age: 60 y.o. MRN: [id number]  Chief Complaint  Patient presents with   Hypertension     HPI HTN- Lisinopril 10 mg daily. Has had a dry cough for a while. Unsure if since covid 19 or before.   In pt's 20s, she had leg pain/ache. Saw a doctor and they did not figure out what cause  was and treatment was unsuccessful. Has had recurrences over the years and her husband has rubbed her legs and this helps. Worse with weather changes. Feels like toes are cold. 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 Pt has orthotics and was given 2 courses of meloxicam without improvement. Pt is planning to schedule follow up.   Low back pain: resolved since surgery.   Current Outpatient Medications on File Prior to Visit  Medication Sig Dispense Refill   aspirin EC 81 MG tablet Take 1 tablet (81 mg total) by mouth daily. 90 tablet 3   cetirizine (ZYRTEC) 5 MG tablet Take 10 mg by mouth daily as needed.      estradiol (ESTRACE) 2 MG tablet Take 1 tablet (2 mg total) by mouth daily. 90 tablet 0   Famotidine (PEPCID AC MAXIMUM STRENGTH) 20 MG CHEW Chew 1 each by mouth daily as needed.     fluticasone (FLONASE) 50 MCG/ACT nasal spray Place 1 spray into both nostrils daily as needed.      lisinopril (ZESTRIL) 10 MG tablet Take 1 tablet (10 mg total) by mouth daily. 90 tablet 0   medroxyPROGESTERone (PROVERA) 2.5 MG tablet Take 1 tablet (2.5 mg total) by mouth daily. 90 tablet 0   Multiple Vitamins-Minerals (THERA-M) TABS Take 1 tablet by mouth daily.      No current facility-administered medications on file prior to visit.   Past Medical History:  Diagnosis Date   Allergy    Arthritis    Dysmenorrhea    GERD (gastroesophageal reflux disease)    Migraine    Ovarian cyst    TMJ (dislocation of temporomandibular joint)    Past Surgical History:  Procedure Laterality Date   BACK SURGERY  2018    CHOLECYSTECTOMY     ENDOMETRIAL ABLATION  09/25/2011   KNEE SURGERY      Family History  Problem Relation Age of Onset   Hypertension Mother    Fibroids Mother    Kidney failure Mother    Chronic Renal Failure Mother    Arrhythmia Mother        A-Fib   Hypertension Father    Congestive Heart Failure Father    Arrhythmia Father    Ovarian cancer Maternal Grandmother    Osteoarthritis Other    Social History   Socioeconomic History   Marital status: Married    Spouse name: Not on file   Number of children: Not on file   Years of education: Not on file   Highest education level: Not on file  Occupational History   Not on file  Tobacco Use   Smoking status: Never   Smokeless tobacco: Never  Vaping Use   Vaping Use: Never used  Substance and Sexual Activity   Alcohol use: Yes    Alcohol/week: 0.0 standard drinks    Comment: occ-rare   Drug use: Never   Sexual activity: Not on file  Other Topics Concern   Not on file  Social History Narrative   Not on file  Social Determinants of Health   Financial Resource Strain: Not on file  Food Insecurity: Not on file  Transportation Needs: Not on file  Physical Activity: Not on file  Stress: Not on file  Social Connections: Not on file    Review of Systems  Constitutional:  Negative for chills, fatigue and fever.  HENT:  Negative for congestion, ear pain, rhinorrhea and sore throat.   Respiratory:  Negative for cough and shortness of breath.   Cardiovascular:  Negative for chest pain.  Gastrointestinal:  Negative for abdominal pain, constipation, diarrhea, nausea and vomiting.  Genitourinary:  Negative for dysuria and urgency.  Musculoskeletal:  Positive for arthralgias and back pain. Negative for myalgias.  Neurological:  Negative for dizziness, weakness, light-headedness and headaches.  Psychiatric/Behavioral:  Negative for dysphoric mood. The patient is not nervous/anxious.     Objective:  BP 124/82   Pulse 74    Temp (!) 97.3 F (36.3 C)   Ht 5\' 2"  (1.575 m)   Wt 157 lb (71.2 kg)   SpO2 98%   BMI 28.72 kg/m   BP/Weight 12/02/2020 10/03/2019 09/05/2019  Systolic BP 124 124 138  Diastolic BP 82 80 74  Wt. (Lbs) 157 155 155  BMI 28.72 28.35 28.35    Physical Exam Vitals reviewed.  Constitutional:      Appearance: Normal appearance. She is normal weight.  Neck:     Vascular: No carotid bruit.  Cardiovascular:     Rate and Rhythm: Normal rate and regular rhythm.     Pulses:          Dorsalis pedis pulses are 0 on the right side and 0 on the left side.     Heart sounds: Normal heart sounds.     Comments: Feet warm.  BL capillary refill. Pulmonary:     Effort: Pulmonary effort is normal. No respiratory distress.     Breath sounds: Normal breath sounds.  Abdominal:     General: Abdomen is flat. Bowel sounds are normal.     Palpations: Abdomen is soft.     Tenderness: There is no abdominal tenderness.  Musculoskeletal:        General: Normal range of motion.  Neurological:     Mental Status: She is alert and oriented to person, place, and time.  Psychiatric:        Mood and Affect: Mood normal.        Behavior: Behavior normal.    Diabetic Foot Exam - Simple   No data filed      Lab Results  Component Value Date   WBC 6.5 10/03/2019   HGB 13.1 10/03/2019   HCT 40.0 10/03/2019   PLT 267 10/03/2019   GLUCOSE 93 10/03/2019   CHOL 170 10/03/2019   TRIG 92 10/03/2019   HDL 60 10/03/2019   LDLCALC 93 10/03/2019   ALT 7 10/03/2019   AST 11 10/03/2019   NA 139 10/03/2019   K 4.8 10/03/2019   CL 105 10/03/2019   CREATININE 0.71 10/03/2019   BUN 9 10/03/2019   CO2 21 10/03/2019   TSH 2.310 10/03/2019      Assessment & Plan:   Problem List Items Addressed This Visit   None Visit Diagnoses     Benign hypertension    -  Primary   Relevant Orders   CBC with Differential/Platelet   Comprehensive metabolic panel   Lipid panel   Encounter for immunization        Relevant Orders   Flu  Vaccine MDCK QUAD PF (Completed)   Decreased pulses in feet       Relevant Orders   Ankle Brachial Index Assessment (BFP)     .  Meds ordered this encounter  Medications   metroNIDAZOLE (METROCREAM) 0.75 % cream    Sig: Apply topically 2 (two) times daily.    Dispense:  1 g    Refill:  0     Orders Placed This Encounter  Procedures   Flu Vaccine MDCK QUAD PF   CBC with Differential/Platelet   Comprehensive metabolic panel   Lipid panel   Ankle Brachial Index Assessment (BFP)      Follow-up: Return in about 4 weeks (around 12/30/2020) for cpe.  An After Visit Summary was printed and given to the patient.  Blane Ohara, MD [facility] [phone]

## 2020-12-03 LAB — COMPREHENSIVE METABOLIC PANEL
ALT: 15 IU/L (ref 0–32)
AST: 14 IU/L (ref 0–40)
Albumin/Globulin Ratio: 1.7 (ref 1.2–2.2)
Albumin: 3.8 g/dL (ref 3.8–4.9)
Alkaline Phosphatase: 145 IU/L — ABNORMAL HIGH (ref 44–121)
BUN/Creatinine Ratio: 13 (ref 9–23)
BUN: 9 mg/dL (ref 6–24)
Bilirubin Total: 0.2 mg/dL (ref 0.0–1.2)
CO2: 23 mmol/L (ref 20–29)
Calcium: 9.2 mg/dL (ref 8.7–10.2)
Chloride: 105 mmol/L (ref 96–106)
Creatinine, Ser: 0.7 mg/dL (ref 0.57–1.00)
Globulin, Total: 2.3 g/dL (ref 1.5–4.5)
Glucose: 98 mg/dL (ref 65–99)
Potassium: 4.3 mmol/L (ref 3.5–5.2)
Sodium: 140 mmol/L (ref 134–144)
Total Protein: 6.1 g/dL (ref 6.0–8.5)
eGFR: 100 mL/min/{1.73_m2} (ref >59)

## 2020-12-03 LAB — CBC WITH DIFFERENTIAL/PLATELET
Basophils Absolute: 0 10*3/uL (ref 0.0–0.2)
Basos: 1 %
EOS (ABSOLUTE): 0.2 10*3/uL (ref 0.0–0.4)
Eos: 3 %
Hematocrit: 41 % (ref 34.0–46.6)
Hemoglobin: 13 g/dL (ref 11.1–15.9)
Immature Grans (Abs): 0 10*3/uL (ref 0.0–0.1)
Immature Granulocytes: 0 %
Lymphocytes Absolute: 1.5 10*3/uL (ref 0.7–3.1)
Lymphs: 22 %
MCH: 26.3 pg — ABNORMAL LOW (ref 26.6–33.0)
MCHC: 31.7 g/dL (ref 31.5–35.7)
MCV: 83 fL (ref 79–97)
Monocytes Absolute: 0.4 10*3/uL (ref 0.1–0.9)
Monocytes: 6 %
Neutrophils Absolute: 4.7 10*3/uL (ref 1.4–7.0)
Neutrophils: 68 %
Platelets: 286 10*3/uL (ref 150–450)
RBC: 4.95 x10E6/uL (ref 3.77–5.28)
RDW: 12 % (ref 11.7–15.4)
WBC: 6.8 10*3/uL (ref 3.4–10.8)

## 2020-12-03 LAB — LIPID PANEL
Chol/HDL Ratio: 3 ratio (ref 0.0–4.4)
Cholesterol, Total: 186 mg/dL (ref 100–199)
HDL: 62 mg/dL (ref >39)
LDL Chol Calc (NIH): 108 mg/dL — ABNORMAL HIGH (ref 0–99)
Triglycerides: 86 mg/dL (ref 0–149)
VLDL Cholesterol Cal: 16 mg/dL (ref 5–40)

## 2020-12-03 LAB — CARDIOVASCULAR RISK ASSESSMENT

## 2020-12-10 ENCOUNTER — Encounter: Payer: Self-pay | Admitting: Family Medicine

## 2020-12-10 DIAGNOSIS — R0989 Other specified symptoms and signs involving the circulatory and respiratory systems: Secondary | ICD-10-CM | POA: Insufficient documentation

## 2020-12-10 DIAGNOSIS — I10 Essential (primary) hypertension: Secondary | ICD-10-CM | POA: Insufficient documentation

## 2020-12-10 NOTE — Assessment & Plan Note (Signed)
The current medical regimen is effective;  continue present plan and medications. Continue lisinopril 10 mg once daily.  Recommend continue to work on eating healthy diet and exercise.

## 2020-12-10 NOTE — Assessment & Plan Note (Signed)
Check ABIS

## 2020-12-12 ENCOUNTER — Telehealth: Payer: Self-pay | Admitting: Family Medicine

## 2020-12-12 NOTE — Telephone Encounter (Signed)
   Julie Thornton has been scheduled for the following appointment:  WHAT: VASCULAR ULTRASOUND WHERE: [facility] DATE: 12/17/20 TIME: 8:30 AM ARRIVAL TIME  Patient has been made aware.

## 2020-12-23 ENCOUNTER — Other Ambulatory Visit: Payer: Self-pay

## 2020-12-23 ENCOUNTER — Ambulatory Visit (INDEPENDENT_AMBULATORY_CARE_PROVIDER_SITE_OTHER): Payer: Managed Care, Other (non HMO) | Admitting: Podiatry

## 2020-12-23 DIAGNOSIS — M19071 Primary osteoarthritis, right ankle and foot: Secondary | ICD-10-CM

## 2020-12-23 DIAGNOSIS — M19079 Primary osteoarthritis, unspecified ankle and foot: Secondary | ICD-10-CM

## 2020-12-23 MED ORDER — DEXAMETHASONE SODIUM PHOSPHATE 120 MG/30ML IJ SOLN
2.0000 mg | Freq: Once | INTRAMUSCULAR | Status: AC
  Administered 2020-12-23: 2 mg via INTRA_ARTICULAR

## 2020-12-23 NOTE — Progress Notes (Signed)
  Subjective:  Patient ID: Julie Thornton, female    DOB: 11-Feb-1961,  MRN: [id number]  Chief Complaint  Patient presents with   Tendonitis    F/U Rt tendonitis -pt staes," the inserts helps but when I walk feels like a knife stabbing my foot; 8/10." Tx: ace, rubbing and inserts   59 y.o. female presents with the above complaint. History confirmed with patient.   Objective:  Physical Exam: warm, good capillary refill, no trophic changes or ulcerative lesions, normal DP and PT pulses and normal sensory exam. Left Foot: normal exam, no swelling, tenderness, instability; ligaments intact, full range of motion of all ankle/foot joints  Right Foot: normal exam, no swelling, tenderness, instability; ligaments intact, full range of motion of all ankle/foot joints  Assessment:   1. Arthritis of midfoot    Plan:  Patient was evaluated and treated and all questions answered.  Metatarsalgia -Improved with CMOs  Midfoot arthritis right -Injection as below  Procedure: Joint Injection Location: Right 2nd TMT joint Skin Prep: Alcohol. Injectate: 0.5 cc 1% lidocaine plain, 0.5 cc dexamethasone Disposition: Patient tolerated procedure well. Injection site dressed with a band-aid.   Return if symptoms worsen or fail to improve.

## 2021-01-13 ENCOUNTER — Encounter: Payer: Managed Care, Other (non HMO) | Admitting: Family Medicine

## 2021-01-20 ENCOUNTER — Other Ambulatory Visit: Payer: Self-pay

## 2021-01-20 ENCOUNTER — Encounter: Payer: Self-pay | Admitting: Family Medicine

## 2021-01-20 ENCOUNTER — Ambulatory Visit (INDEPENDENT_AMBULATORY_CARE_PROVIDER_SITE_OTHER): Payer: Managed Care, Other (non HMO) | Admitting: Family Medicine

## 2021-01-20 VITALS — BP 138/82 | HR 65 | Temp 97.4°F | Ht 62.0 in | Wt 161.4 lb

## 2021-01-20 DIAGNOSIS — Z6829 Body mass index (BMI) 29.0-29.9, adult: Secondary | ICD-10-CM

## 2021-01-20 DIAGNOSIS — Z0000 Encounter for general adult medical examination without abnormal findings: Secondary | ICD-10-CM | POA: Diagnosis not present

## 2021-01-20 DIAGNOSIS — E663 Overweight: Secondary | ICD-10-CM

## 2021-01-20 MED ORDER — OMEPRAZOLE 40 MG PO CPDR: 40.0000 mg | DELAYED_RELEASE_CAPSULE | Freq: Every day | ORAL | 0 refills | Status: DC

## 2021-01-20 NOTE — Patient Instructions (Addendum)
Things to do to keep yourself healthy  - Exercise at least 30-45 minutes a day, 3-4 days a week.  - Eat a low-fat diet with lots of fruits and vegetables, up to 7-9 servings per day.  - Seatbelts can save your life. Wear them always.  - Smoke detectors on every level of your home, check batteries every year.  - Eye Doctor - have an eye exam every 1-2 years  - Safe sex - if you may be exposed to STDs, use a condom.  - Alcohol -  If you drink, do it moderately, less than 2 drinks per day.  - Health Care Power of Attorney. Choose someone to speak for you if you are not able.  - Depression is common in our stressful world.If you're feeling down or losing interest in things you normally enjoy, please come in    for a visit.  - Violence - If anyone is threatening or hurting you, please call immediately. - check on shingles vaccines (are they covered by your insurance.) - Return for covid 19 booster.  Recommend Calcium citrate with D 1200 mg per day.  Recommend omeprazole 40 mg once daily. May use pepcid also if needed.

## 2021-01-20 NOTE — Progress Notes (Signed)
Subjective:  Patient ID: Julie Thornton, female    DOB: February 07, 1961  Age: 60 y.o. MRN: [id number]  Chief Complaint  Patient presents with   Annual Exam    HPI Well Adult Physical: Patient here for a comprehensive physical exam.The patient reports no problems Do you take any herbs or supplements that were not prescribed by a doctor? yes  Acid reducer supplement for acid reflux. Are you taking calcium supplements? no Are you taking aspirin daily? yes  Encounter for general adult medical examination without abnormal findings  Physical ("At Risk" items are starred): Patient's last physical exam was 1 year ago .  Patient is not afflicted from Stress Incontinence and Urge Incontinence  Patient wears a seat belt, has smoke detectors, has carbon monoxide detectors, practices appropriate gun safety, and wears sunscreen with extended sun exposure. Dental Care: biannual cleanings, brushes and flosses daily. Ophthalmology/Optometry: Annual visit.  Hearing loss: none Vision impairments: none  Menarche: 60 yo Menstrual History: regular heavy menses until had endometrial ablation LMP: 2013 Pregnancy history:  Safe at home: yes.   [place] Office Visit from 01/20/2021 in [place]  PHQ-2 Total Score 0        Social Hx   Social History   Socioeconomic History   Marital status: Married    Spouse name: Not on file   Number of children: Not on file   Years of education: Not on file   Highest education level: Not on file  Occupational History   Not on file  Tobacco Use   Smoking status: Never   Smokeless tobacco: Never  Vaping Use   Vaping Use: Never used  Substance and Sexual Activity   Alcohol use: Yes    Alcohol/week: 0.0 standard drinks    Comment: occ-rare   Drug use: Never   Sexual activity: Not on file  Other Topics Concern   Not on file  Social History Narrative   Not on file   Social Determinants of Health   Financial Resource Strain: Low Risk     Difficulty of Paying Living Expenses: Not hard at all  Food Insecurity: No Food Insecurity   Worried About Charity fundraiser in the Last Year: Never true   [place] in the Last Year: Never true  Transportation Needs: No Transportation Needs   Lack of Transportation (Medical): No   Lack of Transportation (Non-Medical): No  Physical Activity: Not on file  Stress: No Stress Concern Present   Feeling of Stress : Not at all  Social Connections: Not on file   Past Medical History:  Diagnosis Date   Allergy    Arthritis    Dysmenorrhea    GERD (gastroesophageal reflux disease)    History of tubal ligation 05/30/2013   Overview:  Essure @ 8/13.   Migraine    Ovarian cyst    TMJ (dislocation of temporomandibular joint)    Past Surgical History:  Procedure Laterality Date   BACK SURGERY  2018   CHOLECYSTECTOMY     ENDOMETRIAL ABLATION  09/25/2011   KNEE SURGERY      Family History  Problem Relation Age of Onset   Hypertension Mother    Fibroids Mother    Kidney failure Mother    Chronic Renal Failure Mother    Arrhythmia Mother        A-Fib   Hypertension Father    Congestive Heart Failure Father    Arrhythmia Father    Ovarian cancer Maternal Grandmother  Osteoarthritis Other     Review of Systems  Constitutional:  Negative for appetite change, fatigue and fever.  HENT:  Negative for congestion, ear pain, sinus pressure and sore throat.   Eyes:  Negative for pain.  Respiratory:  Negative for cough, chest tightness, shortness of breath and wheezing.   Cardiovascular:  Negative for chest pain and palpitations.  Gastrointestinal:  Negative for abdominal pain, constipation, diarrhea, nausea and vomiting.       Acid reflux  Genitourinary:  Negative for dysuria and hematuria.  Musculoskeletal:  Negative for arthralgias, back pain, joint swelling and myalgias.  Skin:  Negative for rash.  Neurological:  Negative for dizziness, weakness and headaches.   Psychiatric/Behavioral:  Negative for dysphoric mood. The patient is not nervous/anxious.     Objective:  BP 138/82 (BP Location: Left Arm, Patient Position: Sitting)   Pulse 65   Temp (!) 97.4 F (36.3 C) (Temporal)   Ht 5\' 2"  (1.575 m)   Wt 161 lb 6.4 oz (73.2 kg)   SpO2 99%   BMI 29.52 kg/m   BP/Weight 01/20/2021 12/02/2020 [id number]  Systolic BP 0000000 [id number]  Diastolic BP 82 82 80  Wt. (Lbs) 161.4 157 155  BMI 29.52 28.72 28.35    Physical Exam Vitals reviewed.  Constitutional:      Appearance: Normal appearance. She is normal weight.  HENT:     Right Ear: Tympanic membrane normal.     Left Ear: Tympanic membrane normal.     Nose: Nose normal.     Mouth/Throat:     Mouth: Mucous membranes are moist.  Eyes:     Conjunctiva/sclera: Conjunctivae normal.  Neck:     Vascular: No carotid bruit.  Cardiovascular:     Rate and Rhythm: Normal rate and regular rhythm.     Pulses: Normal pulses.     Heart sounds: Normal heart sounds.  Pulmonary:     Effort: Pulmonary effort is normal. No respiratory distress.     Breath sounds: Normal breath sounds.  Abdominal:     General: Abdomen is flat. Bowel sounds are normal.     Palpations: Abdomen is soft.     Tenderness: There is no abdominal tenderness.  Skin:    Findings: No lesion or rash.  Neurological:     Mental Status: She is alert and oriented to person, place, and time.  Psychiatric:        Mood and Affect: Mood normal.        Behavior: Behavior normal.    Lab Results  Component Value Date   WBC 6.8 12/02/2020   HGB 13.0 12/02/2020   HCT 41.0 12/02/2020   PLT 286 12/02/2020   GLUCOSE 98 12/02/2020   CHOL 186 12/02/2020   TRIG 86 12/02/2020   HDL 62 12/02/2020   LDLCALC 108 (H) 12/02/2020   ALT 15 12/02/2020   AST 14 12/02/2020   NA 140 12/02/2020   K 4.3 12/02/2020   CL 105 12/02/2020   CREATININE 0.70 12/02/2020   BUN 9 12/02/2020   CO2 23 12/02/2020   TSH 2.310 10/03/2019      Assessment &  Plan:   Problem List Items Addressed This Visit       Other   General medical exam - Primary    Things to do to keep yourself healthy  - Exercise at least 30-45 minutes a day, 3-4 days a week.  - Eat a low-fat diet with lots of fruits and vegetables, up to 7-9 servings  per day.  - Seatbelts can save your life. Wear them always.  - Smoke detectors on every level of your home, check batteries every year.  - Eye Doctor - have an eye exam every 1-2 years  - Safe sex - if you may be exposed to STDs, use a condom.  - Alcohol -  If you drink, do it moderately, less than 2 drinks per day.  - Health Care Power of Attorney. Choose someone to speak for you if you are not able.  - Depression is common in our stressful world.If you're feeling down or losing interest in things you normally enjoy, please come in    for a visit.  - Violence - If anyone is threatening or hurting you, please call immediately. - check on shingles vaccines (are they covered by your insurance.) - Return for covid 19 booster.  Recommend Calcium citrate with D 1200 mg per day. Recommend omeprazole 40 mg once daily. May use pepcid also if needed.       Other Visit Diagnoses     Overweight with body mass index (BMI) of 29 to 29.9 in adult              This is a list of the screening recommended for you and due dates:  Health Maintenance  Topic Date Due   HIV Screening  Never done   Hepatitis C Screening: USPSTF Recommendation to screen - Ages 48-79 yo.  Never done   Zoster (Shingles) Vaccine (1 of 2) Never done   COVID-19 Vaccine (3 - Booster for Pfizer series) 08/01/2019   Pap Smear  07/07/2021   Mammogram  07/24/2021   Tetanus Vaccine  07/29/2027   Colon Cancer Screening  07/20/2030   Flu Shot  Completed   Pneumococcal Vaccination  Aged Out   HPV Vaccine  Aged Out     Follow-up: Return in about 6 months (around 07/20/2021) for chronic fasting.  An After Visit Summary was printed and given to the  patient.   I,Lauren M Auman,acting as a scribe for Blane Ohara, MD.,have documented all relevant documentation on the behalf of Blane Ohara, MD,as directed by  Blane Ohara, MD while in the presence of Blane Ohara, MD.   Blane Ohara, MD [facility] [phone]

## 2021-02-01 DIAGNOSIS — Z0000 Encounter for general adult medical examination without abnormal findings: Secondary | ICD-10-CM | POA: Insufficient documentation

## 2021-02-01 NOTE — Assessment & Plan Note (Signed)
Things to do to keep yourself healthy  - Exercise at least 30-45 minutes a day, 3-4 days a week.  - Eat a low-fat diet with lots of fruits and vegetables, up to 7-9 servings per day.  - Seatbelts can save your life. Wear them always.  - Smoke detectors on every level of your home, check batteries every year.  - Eye Doctor - have an eye exam every 1-2 years  - Safe sex - if you may be exposed to STDs, use a condom.  - Alcohol -  If you drink, do it moderately, less than 2 drinks per day.  - Health Care Power of Attorney. Choose someone to speak for you if you are not able.  - Depression is common in our stressful world.If you're feeling down or losing interest in things you normally enjoy, please come in    for a visit.  - Violence - If anyone is threatening or hurting you, please call immediately. - check on shingles vaccines (are they covered by your insurance.) - Return for covid 19 booster.  Recommend Calcium citrate with D 1200 mg per day.  Recommend omeprazole 40 mg once daily. May use pepcid also if needed.   

## 2021-02-17 ENCOUNTER — Other Ambulatory Visit: Payer: Self-pay

## 2021-02-17 ENCOUNTER — Ambulatory Visit: Payer: Managed Care, Other (non HMO)

## 2021-02-17 DIAGNOSIS — M2142 Flat foot [pes planus] (acquired), left foot: Secondary | ICD-10-CM

## 2021-02-17 DIAGNOSIS — M2141 Flat foot [pes planus] (acquired), right foot: Secondary | ICD-10-CM

## 2021-02-17 DIAGNOSIS — M7741 Metatarsalgia, right foot: Secondary | ICD-10-CM

## 2021-02-17 DIAGNOSIS — M19079 Primary osteoarthritis, unspecified ankle and foot: Secondary | ICD-10-CM

## 2021-02-17 NOTE — Progress Notes (Signed)
SITUATION Reason for Consult: Follow-up with functional foot orthoses Patient / Caregiver Report: Patient reports her right foot orthosis is throwing her outwards  OBJECTIVE DATA History / Diagnosis: Arthritis of midfoot  Pes planus of both feet  Metatarsalgia of both feet  Change in Pathology: None  ACTIONS PERFORMED Patient's equipment was checked for structural stability and fit. Reduced heel posting to neutral. Patient reports comfort issue is solved. Device(s) intact and fit is excellent. All questions answered and concerns addressed.  PLAN Follow-up as needed (PRN). Plan of care discussed with and agreed upon by patient / caregiver.

## 2021-02-27 ENCOUNTER — Other Ambulatory Visit: Payer: Self-pay | Admitting: Family Medicine

## 2021-07-22 ENCOUNTER — Ambulatory Visit (INDEPENDENT_AMBULATORY_CARE_PROVIDER_SITE_OTHER): Payer: Managed Care, Other (non HMO) | Admitting: Family Medicine

## 2021-07-22 ENCOUNTER — Encounter: Payer: Self-pay | Admitting: Family Medicine

## 2021-07-22 VITALS — BP 122/64 | HR 74 | Temp 97.0°F | Resp 14 | Ht 62.0 in | Wt 160.0 lb

## 2021-07-22 DIAGNOSIS — I10 Essential (primary) hypertension: Secondary | ICD-10-CM

## 2021-07-22 DIAGNOSIS — M65271 Calcific tendinitis, right ankle and foot: Secondary | ICD-10-CM

## 2021-07-22 DIAGNOSIS — R590 Localized enlarged lymph nodes: Secondary | ICD-10-CM | POA: Diagnosis not present

## 2021-07-22 DIAGNOSIS — E782 Mixed hyperlipidemia: Secondary | ICD-10-CM

## 2021-07-22 NOTE — Progress Notes (Signed)
? ?Subjective:  ?Patient ID: Julie Thornton, female    DOB: Nov 07, 1960  Age: 61 y.o. MRN: [id number] ? ?Chief Complaint  ?Patient presents with  ? Hypertension  ? ?HPI ?Hypertension: on lisinopril 10 mg daily. ?GERD: on pepcid ac. ?HRT: on provera and estradiol.  ?Allergic rhinitis: on flonase as needed and zyrtec as needed.   ?Rosacea: metrogel. ? ?Reviewed and updated above information ?Marland Kitchen ?Right foot pain (tendonitis and OA): Patient saw Dr. Samuella Cota and he give her an injection, but did not help for more than a week. On mobic x 1 month. She is in orthotics and it helps unless on feet all day. Heat helps. Cold hurts.  ? ?ENT note reviewed.  ? ?Current Outpatient Medications on File Prior to Visit  ?Medication Sig Dispense Refill  ? aspirin EC 81 MG tablet Take 1 tablet (81 mg total) by mouth daily. 90 tablet 3  ? cetirizine (ZYRTEC) 5 MG tablet Take 10 mg by mouth daily as needed.     ? estradiol (ESTRACE) 2 MG tablet Take 1 tablet by mouth once daily 90 tablet 1  ? Famotidine (PEPCID AC MAXIMUM STRENGTH) 20 MG CHEW Chew 1 each by mouth daily as needed.    ? fluticasone (FLONASE) 50 MCG/ACT nasal spray Place 1 spray into both nostrils daily as needed.     ? lisinopril (ZESTRIL) 10 MG tablet Take 1 tablet by mouth once daily 90 tablet 1  ? medroxyPROGESTERone (PROVERA) 2.5 MG tablet Take 1 tablet by mouth once daily 90 tablet 1  ? metroNIDAZOLE (METROCREAM) 0.75 % cream Apply topically 2 (two) times daily. 1 g 0  ? Multiple Vitamins-Minerals (THERA-M) TABS Take 1 tablet by mouth daily.     ? ?No current facility-administered medications on file prior to visit.  ? ?Past Medical History:  ?Diagnosis Date  ? Allergy   ? Arthritis   ? Dysmenorrhea   ? GERD (gastroesophageal reflux disease)   ? History of tubal ligation 05/30/2013  ? Overview:  Essure @ 8/13.  ? Migraine   ? Ovarian cyst   ? TMJ (dislocation of temporomandibular joint)   ? ?Past Surgical History:  ?Procedure Laterality Date  ? BACK SURGERY  2018  ?  CHOLECYSTECTOMY    ? ENDOMETRIAL ABLATION  09/25/2011  ? KNEE SURGERY    ?  ?Family History  ?Problem Relation Age of Onset  ? Hypertension Mother   ? Fibroids Mother   ? Kidney failure Mother   ? Chronic Renal Failure Mother   ? Arrhythmia Mother   ?     A-Fib  ? Hypertension Father   ? Congestive Heart Failure Father   ? Arrhythmia Father   ? Ovarian cancer Maternal Grandmother   ? Osteoarthritis Other   ? ?Social History  ? ?Socioeconomic History  ? Marital status: Married  ?  Spouse name: Not on file  ? Number of children: Not on file  ? Years of education: Not on file  ? Highest education level: Not on file  ?Occupational History  ? Not on file  ?Tobacco Use  ? Smoking status: Never  ? Smokeless tobacco: Never  ?Vaping Use  ? Vaping Use: Never used  ?Substance and Sexual Activity  ? Alcohol use: Yes  ?  Alcohol/week: 0.0 standard drinks  ?  Comment: occ-rare  ? Drug use: Never  ? Sexual activity: Not on file  ?Other Topics Concern  ? Not on file  ?Social History Narrative  ? Not on file  ? ?  Social Determinants of Health  ? ?Financial Resource Strain: Low Risk   ? Difficulty of Paying Living Expenses: Not hard at all  ?Food Insecurity: No Food Insecurity  ? Worried About Programme researcher, broadcasting/film/videounning Out of Food in the Last Year: Never true  ? Ran Out of Food in the Last Year: Never true  ?Transportation Needs: No Transportation Needs  ? Lack of Transportation (Medical): No  ? Lack of Transportation (Non-Medical): No  ?Physical Activity: Not on file  ?Stress: No Stress Concern Present  ? Feeling of Stress : Not at all  ?Social Connections: Not on file  ? ? ?Review of Systems  ?Constitutional:  Negative for chills, fatigue and fever.  ?HENT:  Negative for congestion, rhinorrhea and sore throat.   ?Respiratory:  Negative for cough and shortness of breath.   ?Cardiovascular:  Negative for chest pain.  ?Gastrointestinal:  Negative for abdominal pain, constipation, diarrhea, nausea and vomiting.  ?Genitourinary:  Negative for dysuria and  urgency.  ?Musculoskeletal:  Negative for back pain and myalgias (right foot pain).  ?Neurological:  Negative for dizziness, weakness, light-headedness and headaches.  ?Hematological:  Positive for adenopathy (left cervical lymphadenopathy. nontender. ENT wanted to monitor. No nasopharyngoscopy done. She is concerned because her husband has had tongue cancer and it started just like this.).  ?Psychiatric/Behavioral:  Negative for dysphoric mood. The patient is not nervous/anxious.   ? ? ?Objective:  ?BP 122/64   Pulse 74   Temp (!) 97 ?F (36.1 ?C)   Resp 14   Ht 5\' 2"  (1.575 m)   Wt 160 lb (72.6 kg)   BMI 29.26 kg/m?  ? ? ?  07/22/2021  ?  9:33 AM 01/20/2021  ?  3:14 PM 12/02/2020  ?  8:57 AM  ?BP/Weight  ?Systolic BP 122 138 124  ?Diastolic BP 64 82 82  ?Wt. (Lbs) 160 161.4 157  ?BMI 29.26 kg/m2 29.52 kg/m2 28.72 kg/m2  ? ? ?Physical Exam ?Vitals reviewed.  ?Constitutional:   ?   Appearance: Normal appearance. She is normal weight.  ?Neck:  ?   Vascular: No carotid bruit.  ?Cardiovascular:  ?   Rate and Rhythm: Normal rate and regular rhythm.  ?   Heart sounds: Normal heart sounds.  ?Pulmonary:  ?   Effort: Pulmonary effort is normal. No respiratory distress.  ?   Breath sounds: Normal breath sounds.  ?Abdominal:  ?   General: Abdomen is flat. Bowel sounds are normal.  ?   Palpations: Abdomen is soft.  ?   Tenderness: There is no abdominal tenderness.  ?Musculoskeletal:  ?   Comments: Right foot movement causes pain.  ?Lymphadenopathy:  ?   Cervical: Cervical adenopathy present.  ?   Right cervical: No deep cervical adenopathy. ?   Left cervical: Deep cervical adenopathy (left sided. 2 cm diameter. nontender.) present.  ?Skin: ?   Findings: Rash (rosacea. mild erythema.) present.  ?Neurological:  ?   Mental Status: She is alert and oriented to person, place, and time.  ?Psychiatric:     ?   Mood and Affect: Mood normal.     ?   Behavior: Behavior normal.  ? ? ?Diabetic Foot Exam - Simple   ?No data filed ?  ?   ? ?Lab Results  ?Component Value Date  ? WBC 6.5 07/22/2021  ? HGB 13.1 07/22/2021  ? HCT 39.5 07/22/2021  ? PLT 266 07/22/2021  ? GLUCOSE 94 07/22/2021  ? CHOL 176 07/22/2021  ? TRIG 82 07/22/2021  ? HDL  63 07/22/2021  ? LDLCALC 98 07/22/2021  ? ALT 12 07/22/2021  ? AST 16 07/22/2021  ? NA 139 07/22/2021  ? K 4.9 07/22/2021  ? CL 106 07/22/2021  ? CREATININE 0.67 07/22/2021  ? BUN 9 07/22/2021  ? CO2 21 07/22/2021  ? TSH 2.310 10/03/2019  ? ? ? ? ?Assessment & Plan:  ? ?Problem List Items Addressed This Visit   ? ?  ? Cardiovascular and Mediastinum  ? Benign hypertension  ?  The current medical regimen is effective;  continue present plan and medications. ?Continue lisinopril 10 mg daily.  ?Check labs.  ? ?  ?  ?  ? Musculoskeletal and Integument  ? Calcific tendonitis of right foot  ?  Refer to orthopedics.  ? ?  ?  ? Relevant Orders  ? AMB referral to orthopedics  ?  ? Immune and Lymphatic  ? LAD (lymphadenopathy) of left cervical region - Primary  ?  Check ct scan of neck. ? ?  ?  ? Relevant Orders  ? CT Soft Tissue Neck W Contrast  ? CBC with Differential/Platelet (Completed)  ?  ? Other  ? Mixed hyperlipidemia  ?  Check lipids.  ?Recommend continue to work on eating healthy diet and exercise. ?Await labs/testing for assessment and recommendations. ? ? ?  ?  ? Relevant Orders  ? Comprehensive metabolic panel (Completed)  ? Lipid panel (Completed)  ?. ? ?Orders Placed This Encounter  ?Procedures  ? CT Soft Tissue Neck W Contrast  ? CBC with Differential/Platelet  ? Comprehensive metabolic panel  ? Lipid panel  ? Cardiovascular Risk Assessment  ? AMB referral to orthopedics  ?  ? ?Follow-up: Return in about 6 months (around 01/22/2022) for cpe. ? ?An After Visit Summary was printed and given to the patient. ? ?Blane Ohara, MD ?[facility] ?[phone] ?

## 2021-07-23 DIAGNOSIS — E782 Mixed hyperlipidemia: Secondary | ICD-10-CM | POA: Insufficient documentation

## 2021-07-23 DIAGNOSIS — R590 Localized enlarged lymph nodes: Secondary | ICD-10-CM | POA: Insufficient documentation

## 2021-07-23 DIAGNOSIS — M65271 Calcific tendinitis, right ankle and foot: Secondary | ICD-10-CM | POA: Insufficient documentation

## 2021-07-23 LAB — COMPREHENSIVE METABOLIC PANEL
ALT: 12 IU/L (ref 0–32)
AST: 16 IU/L (ref 0–40)
Albumin/Globulin Ratio: 1.7 (ref 1.2–2.2)
Albumin: 3.7 g/dL — ABNORMAL LOW (ref 3.8–4.9)
Alkaline Phosphatase: 122 IU/L — ABNORMAL HIGH (ref 44–121)
BUN/Creatinine Ratio: 13 (ref 9–23)
BUN: 9 mg/dL (ref 6–24)
Bilirubin Total: 0.3 mg/dL (ref 0.0–1.2)
CO2: 21 mmol/L (ref 20–29)
Calcium: 9 mg/dL (ref 8.7–10.2)
Chloride: 106 mmol/L (ref 96–106)
Creatinine, Ser: 0.67 mg/dL (ref 0.57–1.00)
Globulin, Total: 2.2 g/dL (ref 1.5–4.5)
Glucose: 94 mg/dL (ref 70–99)
Potassium: 4.9 mmol/L (ref 3.5–5.2)
Sodium: 139 mmol/L (ref 134–144)
Total Protein: 5.9 g/dL — ABNORMAL LOW (ref 6.0–8.5)
eGFR: 101 mL/min/{1.73_m2} (ref >59)

## 2021-07-23 LAB — CBC WITH DIFFERENTIAL/PLATELET
Basophils Absolute: 0.1 10*3/uL (ref 0.0–0.2)
Basos: 1 %
EOS (ABSOLUTE): 0.4 10*3/uL (ref 0.0–0.4)
Eos: 6 %
Hematocrit: 39.5 % (ref 34.0–46.6)
Hemoglobin: 13.1 g/dL (ref 11.1–15.9)
Immature Grans (Abs): 0 10*3/uL (ref 0.0–0.1)
Immature Granulocytes: 0 %
Lymphocytes Absolute: 1.5 10*3/uL (ref 0.7–3.1)
Lymphs: 23 %
MCH: 27.1 pg (ref 26.6–33.0)
MCHC: 33.2 g/dL (ref 31.5–35.7)
MCV: 82 fL (ref 79–97)
Monocytes Absolute: 0.4 10*3/uL (ref 0.1–0.9)
Monocytes: 6 %
Neutrophils Absolute: 4.2 10*3/uL (ref 1.4–7.0)
Neutrophils: 64 %
Platelets: 266 10*3/uL (ref 150–450)
RBC: 4.84 x10E6/uL (ref 3.77–5.28)
RDW: 12.3 % (ref 11.7–15.4)
WBC: 6.5 10*3/uL (ref 3.4–10.8)

## 2021-07-23 LAB — LIPID PANEL
Chol/HDL Ratio: 2.8 ratio (ref 0.0–4.4)
Cholesterol, Total: 176 mg/dL (ref 100–199)
HDL: 63 mg/dL (ref >39)
LDL Chol Calc (NIH): 98 mg/dL (ref 0–99)
Triglycerides: 82 mg/dL (ref 0–149)
VLDL Cholesterol Cal: 15 mg/dL (ref 5–40)

## 2021-07-23 NOTE — Assessment & Plan Note (Signed)
Check ct scan of neck. ?

## 2021-07-23 NOTE — Assessment & Plan Note (Signed)
Refer to orthopedics 

## 2021-07-23 NOTE — Assessment & Plan Note (Signed)
Check lipids.  ?Recommend continue to work on eating healthy diet and exercise. ?Await labs/testing for assessment and recommendations. ? ?

## 2021-07-23 NOTE — Assessment & Plan Note (Signed)
The current medical regimen is effective;  continue present plan and medications. ?Continue lisinopril 10 mg daily.  ?Check labs.  ?

## 2021-08-11 DIAGNOSIS — M7670 Peroneal tendinitis, unspecified leg: Secondary | ICD-10-CM | POA: Insufficient documentation

## 2021-08-26 ENCOUNTER — Other Ambulatory Visit: Payer: Self-pay | Admitting: Family Medicine

## 2021-09-08 ENCOUNTER — Ambulatory Visit (INDEPENDENT_AMBULATORY_CARE_PROVIDER_SITE_OTHER): Payer: Managed Care, Other (non HMO) | Admitting: Family Medicine

## 2021-09-08 ENCOUNTER — Encounter: Payer: Self-pay | Admitting: Family Medicine

## 2021-09-08 VITALS — BP 110/68 | HR 72 | Temp 96.0°F | Resp 16 | Ht 62.0 in | Wt 159.0 lb

## 2021-09-08 DIAGNOSIS — Z114 Encounter for screening for human immunodeficiency virus [HIV]: Secondary | ICD-10-CM

## 2021-09-08 DIAGNOSIS — Z0000 Encounter for general adult medical examination without abnormal findings: Secondary | ICD-10-CM

## 2021-09-08 DIAGNOSIS — Z1159 Encounter for screening for other viral diseases: Secondary | ICD-10-CM

## 2021-09-08 DIAGNOSIS — Z124 Encounter for screening for malignant neoplasm of cervix: Secondary | ICD-10-CM | POA: Diagnosis not present

## 2021-09-08 NOTE — Progress Notes (Unsigned)
Subjective:  Patient ID: Julie Thornton, female    DOB: 1960/06/13  Age: 61 y.o. MRN: [id number]  Chief Complaint  Patient presents with  . Annual Exam    HPI Well Adult Physical: Patient here for a comprehensive physical exam.The patient reports no problems Do you take any herbs or supplements that were not prescribed by a doctor? no Are you taking calcium supplements? no Are you taking aspirin daily? yes  Physical ("At Risk" items are starred): Patient's last physical exam was 1 year ago .  Safety: reviewed ; Patient wears a seat belt, has smoke detectors, has carbon monoxide detectors, practices appropriate gun safety, and wears sunscreen with extended sun exposure. Dental Care: biannual cleanings, brushes and flosses daily. Ophthalmology/Optometry: Annual visit.  Hearing loss: none Vision impairments: glasses.      12/02/2020    8:59 AM  Fall Risk   Falls in the past year? 0  Number falls in past yr: 0  Injury with Fall? 0  Risk for fall due to : No Fall Risks  Follow up Falls evaluation completed        09/08/2021    2:11 PM 01/20/2021   10:05 PM 12/02/2020    9:00 AM 10/03/2019    8:52 AM  Depression screen PHQ 2/9  Decreased Interest 0 0 0 0  Down, Depressed, Hopeless 0 0 0 0  PHQ - 2 Score 0 0 0 0       Functional Status Survey:     Health Maintenance  Topic Date Due  . Hepatitis C Screening: USPSTF Recommendation to screen - Ages 66-79 yo.  Never done  . Zoster (Shingles) Vaccine (1 of 2) Never done  . COVID-19 Vaccine (3 - Pfizer series) 08/01/2019  . Pap Smear  07/07/2021  . Mammogram  07/24/2021  . Flu Shot  10/07/2021  . Tetanus Vaccine  07/29/2027  . Colon Cancer Screening  07/20/2030  . HIV Screening  Completed  . HPV Vaccine  Aged Out     Social Hx   Social History   Socioeconomic History  . Marital status: Married    Spouse name: Not on file  . Number of children: Not on file  . Years of education: Not on file  . Highest education level:  Not on file  Occupational History  . Not on file  Tobacco Use  . Smoking status: Never  . Smokeless tobacco: Never  Vaping Use  . Vaping Use: Never used  Substance and Sexual Activity  . Alcohol use: Yes    Alcohol/week: 0.0 standard drinks of alcohol    Comment: occ-rare  . Drug use: Never  . Sexual activity: Not on file  Other Topics Concern  . Not on file  Social History Narrative  . Not on file   Social Determinants of Health   Financial Resource Strain: Low Risk  (09/08/2021)   Overall Financial Resource Strain (CARDIA)   . Difficulty of Paying Living Expenses: Not hard at all  Food Insecurity: No Food Insecurity (09/08/2021)   Hunger Vital Sign   . Worried About Programme researcher, broadcasting/film/video in the Last Year: Never true   . Ran Out of Food in the Last Year: Never true  Transportation Needs: No Transportation Needs (09/08/2021)   PRAPARE - Transportation   . Lack of Transportation (Medical): No   . Lack of Transportation (Non-Medical): No  Physical Activity: Insufficiently Active (09/08/2021)   Exercise Vital Sign   . Days of Exercise per Week: 4  days   . Minutes of Exercise per Session: 30 min  Stress: No Stress Concern Present (09/08/2021)   [employer] of Occupational Health - Occupational Stress Questionnaire   . Feeling of Stress : Not at all  Social Connections: Socially Integrated (09/08/2021)   Social Connection and Isolation Panel [NHANES]   . Frequency of Communication with Friends and Family: More than three times a week   . Frequency of Social Gatherings with Friends and Family: More than three times a week   . Attends Religious Services: More than 4 times per year   . Active Member of Clubs or Organizations: Yes   . Attends Banker Meetings: 1 to 4 times per year   . Marital Status: Married   Past Medical History:  Diagnosis Date  . Allergy   . Arthritis   . Dysmenorrhea   . GERD (gastroesophageal reflux disease)   . History of tubal ligation  05/30/2013   Overview:  Essure @ 8/13.  . Migraine   . Ovarian cyst   . TMJ (dislocation of temporomandibular joint)    Family History  Problem Relation Age of Onset  . Hypertension Mother   . Fibroids Mother   . Kidney failure Mother   . Chronic Renal Failure Mother   . Arrhythmia Mother        A-Fib  . Hypertension Father   . Congestive Heart Failure Father   . Arrhythmia Father   . Ovarian cancer Maternal Grandmother   . Osteoarthritis Other     Review of Systems  Constitutional:  Negative for chills, fatigue and fever.  HENT:  Negative for congestion, rhinorrhea and sore throat.   Respiratory:  Negative for cough and shortness of breath.   Cardiovascular:  Negative for chest pain.  Gastrointestinal:  Negative for abdominal pain, constipation, diarrhea, nausea and vomiting.  Genitourinary:  Negative for dysuria and urgency.  Musculoskeletal:  Positive for arthralgias (right foot pain). Negative for back pain and myalgias.  Neurological:  Negative for dizziness, weakness, light-headedness and headaches.  Psychiatric/Behavioral:  Negative for dysphoric mood. The patient is not nervous/anxious.      Objective:  BP 110/68   Pulse 72   Temp (!) 96 F (35.6 C)   Resp 16   Ht 5\' 2"  (1.575 m)   Wt 159 lb (72.1 kg)   BMI 29.08 kg/m      09/08/2021    2:06 PM 07/22/2021    9:33 AM 01/20/2021    3:14 PM  BP/Weight  Systolic BP 110 122 138  Diastolic BP 68 64 82  Wt. (Lbs) 159 160 161.4  BMI 29.08 kg/m2 29.26 kg/m2 29.52 kg/m2    Physical Exam Vitals reviewed. Exam conducted with a chaperone present.  Constitutional:      General: She is not in acute distress.    Appearance: Normal appearance. She is normal weight.  HENT:     Right Ear: Tympanic membrane and ear canal normal.     Left Ear: Tympanic membrane and ear canal normal.     Nose: Nose normal. No congestion or rhinorrhea.  Eyes:     Conjunctiva/sclera: Conjunctivae normal.  Neck:     Thyroid: No thyroid  mass.     Vascular: No carotid bruit.  Cardiovascular:     Rate and Rhythm: Normal rate and regular rhythm.     Pulses: Normal pulses.     Heart sounds: Normal heart sounds. No murmur heard. Pulmonary:     Effort: Pulmonary effort  is normal.     Breath sounds: Normal breath sounds.  Abdominal:     General: Bowel sounds are normal.     Palpations: Abdomen is soft. There is no mass.     Tenderness: There is no abdominal tenderness.  Genitourinary:    General: Normal vulva.     Exam position: Lithotomy position.     Vagina: Normal.     Cervix: Normal.  Musculoskeletal:        General: Normal range of motion.  Lymphadenopathy:     Cervical: No cervical adenopathy.  Skin:    General: Skin is warm and dry.  Neurological:     Mental Status: She is alert and oriented to person, place, and time.     Cranial Nerves: No cranial nerve deficit.  Psychiatric:        Mood and Affect: Mood normal.        Behavior: Behavior normal.    Lab Results  Component Value Date   WBC 6.5 07/22/2021   HGB 13.1 07/22/2021   HCT 39.5 07/22/2021   PLT 266 07/22/2021   GLUCOSE 94 07/22/2021   CHOL 176 07/22/2021   TRIG 82 07/22/2021   HDL 63 07/22/2021   LDLCALC 98 07/22/2021   ALT 12 07/22/2021   AST 16 07/22/2021   NA 139 07/22/2021   K 4.9 07/22/2021   CL 106 07/22/2021   CREATININE 0.67 07/22/2021   BUN 9 07/22/2021   CO2 21 07/22/2021   TSH 2.310 10/03/2019      Assessment & Plan:   Problem List Items Addressed This Visit   None Visit Diagnoses     Encounter for screening for HIV    -  Primary   Relevant Orders   HIV Antibody (routine testing w rflx)   Encounter for hepatitis C screening test for low risk patient       Relevant Orders   HCV Ab w Reflex to Quant PCR   Cervical cancer screening       Relevant Orders   IGP, Aptima HPV, rfx 16/18,45   Routine medical exam            No orders of the defined types were placed in this encounter.   These are the goals we  discussed:  Goals   None      This is a list of the screening recommended for you and due dates:  Health Maintenance  Topic Date Due  . Hepatitis C Screening: USPSTF Recommendation to screen - Ages 95-79 yo.  Never done  . Zoster (Shingles) Vaccine (1 of 2) Never done  . COVID-19 Vaccine (3 - Pfizer series) 08/01/2019  . Pap Smear  07/07/2021  . Mammogram  07/24/2021  . Flu Shot  10/07/2021  . Tetanus Vaccine  07/29/2027  . Colon Cancer Screening  07/20/2030  . HIV Screening  Completed  . HPV Vaccine  Aged Out     AN INDIVIDUALIZED CARE PLAN: was established or reinforced today.   SELF MANAGEMENT: The patient and I together assessed ways to personally work towards obtaining the recommended goals  Support needs The patient and/or family needs were assessed and services were offered and not necessary at this time.    Follow-up: No follow-ups on file.  Blane Ohara, MD [facility] [phone]

## 2021-09-08 NOTE — Patient Instructions (Addendum)
Things to do to keep yourself healthy  - Exercise at least 30-45 minutes a day, 3-4 days a week.  - Eat a low-fat diet with lots of fruits and vegetables, up to 7-9 servings per day.  - Seatbelts can save your life. Wear them always.  - Smoke detectors on every level of your home, check batteries every year.  - Eye Doctor - have an eye exam every 1-2 years  - Alcohol -  If you drink, do it moderately, less than 2 drinks per day.  - Health Care Power of Attorney. Choose someone to speak for you if you are not able.  - Depression is common in our stressful world.If you're feeling down or losing interest in things you normally enjoy, please come in for a visit.  - Violence - If anyone is threatening or hurting you, please call immediately.  - Recommend to get Calcium with D 1200 mg in diet or through a supplement.  Stop premarin and estradiol.

## 2021-09-09 ENCOUNTER — Encounter: Payer: Self-pay | Admitting: Family Medicine

## 2021-09-09 DIAGNOSIS — Z124 Encounter for screening for malignant neoplasm of cervix: Secondary | ICD-10-CM | POA: Insufficient documentation

## 2021-09-09 DIAGNOSIS — Z1159 Encounter for screening for other viral diseases: Secondary | ICD-10-CM | POA: Insufficient documentation

## 2021-09-09 DIAGNOSIS — Z114 Encounter for screening for human immunodeficiency virus [HIV]: Secondary | ICD-10-CM | POA: Insufficient documentation

## 2021-09-09 LAB — HCV AB W REFLEX TO QUANT PCR: HCV Ab: NONREACTIVE

## 2021-09-09 LAB — HIV ANTIBODY (ROUTINE TESTING W REFLEX): HIV Screen 4th Generation wRfx: NONREACTIVE

## 2021-09-09 NOTE — Assessment & Plan Note (Signed)
Pap sent ?

## 2021-09-09 NOTE — Assessment & Plan Note (Signed)
Things to do to keep yourself healthy  - Exercise at least 30-45 minutes a day, 3-4 days a week.  - Eat a low-fat diet with lots of fruits and vegetables, up to 7-9 servings per day.  - Seatbelts can save your life. Wear them always.  - Smoke detectors on every level of your home, check batteries every year.  - Eye Doctor - have an eye exam every 1-2 years  - Alcohol -  If you drink, do it moderately, less than 2 drinks per day.  - Health Care Power of Attorney. Choose someone to speak for you if you are not able.  - Depression is common in our stressful world.If you're feeling down or losing interest in things you normally enjoy, please come in for a visit.  - Violence - If anyone is threatening or hurting you, please call immediately.  - Recommend to get Calcium with D 1200 mg in diet or through a supplement.  Stop premarin and estradiol.   

## 2021-09-10 ENCOUNTER — Telehealth: Payer: Self-pay

## 2021-09-10 NOTE — Telephone Encounter (Signed)
Patient was seen at office for lymphadenopathy of left cervical region on 07/22/2021. Dr Sedalia Muta ordered CT soft tissue neck w contrast but it was denied by insurance.  I called patient and asked if she still has the swelling on the neck and she reported symptoms are resolved. Dr Sedalia Muta recommended to monitor it. Patient was agreed with the plan. We recommended if she feels it again, or she noticed any new symptoms, call us back.

## 2021-09-15 LAB — IGP, APTIMA HPV, RFX 16/18,45
HPV Aptima: NEGATIVE
PAP Smear Comment: 0

## 2021-09-22 LAB — HM MAMMOGRAPHY

## 2021-09-24 ENCOUNTER — Encounter: Payer: Self-pay | Admitting: Family Medicine

## 2021-11-21 ENCOUNTER — Other Ambulatory Visit: Payer: Self-pay | Admitting: Family Medicine

## 2022-10-15 ENCOUNTER — Ambulatory Visit (INDEPENDENT_AMBULATORY_CARE_PROVIDER_SITE_OTHER): Payer: Managed Care, Other (non HMO)

## 2022-10-15 VITALS — BP 130/80 | HR 78 | Temp 97.1°F | Ht 62.0 in | Wt 161.0 lb

## 2022-10-15 DIAGNOSIS — I10 Essential (primary) hypertension: Secondary | ICD-10-CM

## 2022-10-15 DIAGNOSIS — Z1231 Encounter for screening mammogram for malignant neoplasm of breast: Secondary | ICD-10-CM | POA: Diagnosis not present

## 2022-10-15 DIAGNOSIS — N951 Menopausal and female climacteric states: Secondary | ICD-10-CM | POA: Diagnosis not present

## 2022-10-15 DIAGNOSIS — Z0000 Encounter for general adult medical examination without abnormal findings: Secondary | ICD-10-CM | POA: Diagnosis not present

## 2022-10-15 DIAGNOSIS — Z13220 Encounter for screening for lipoid disorders: Secondary | ICD-10-CM | POA: Diagnosis not present

## 2022-10-15 DIAGNOSIS — Z23 Encounter for immunization: Secondary | ICD-10-CM

## 2022-10-15 MED ORDER — GABAPENTIN 100 MG PO CAPS
100.0000 mg | ORAL_CAPSULE | Freq: Every day | ORAL | 0 refills | Status: AC
Start: 2022-10-15 — End: 2023-01-13

## 2022-10-15 NOTE — Assessment & Plan Note (Signed)
Blood pressure currently well-controlled with lisinopril 10 mg daily Ordered metabolic profile to check on kidney function

## 2022-10-15 NOTE — Assessment & Plan Note (Signed)
Annual physical exam done Mammogram ordered today First dose of Shingrix vaccine given in office Age-appropriate aspiratory guidance provided Return for wellness exam in 1 year

## 2022-10-15 NOTE — Assessment & Plan Note (Signed)
Ordered screening mammogram.

## 2022-10-15 NOTE — Assessment & Plan Note (Signed)
She has already tried combination hormone replacement therapy Not keen on going back on it, though she has been using OTC estrogen cream  Has tried and failed Effexor as it made her have "flat affect" Will trial GABAPENTIN 100 mg  at bedtime to help with her hot flashes and arthralgias. Sent 90 caps to her pharmacy. She could increase the dose if one pill does not help enough in 1-2 weeks.

## 2022-10-15 NOTE — Patient Instructions (Signed)
Wellness exam done today Started you on Gabapentin to see if it helps with your hot flashes Blood work today First shingrix vaccine given Ordered screening mammogram  Follow up in a year for wellness or sooner if any problems.

## 2022-10-15 NOTE — Progress Notes (Signed)
Subjective:  Patient ID: Julie Thornton, female    DOB: 1960/11/22  Age: 62 y.o. MRN: [id number]  Chief Complaint  Patient presents with   Annual Exam   RENATA  HPI Well Adult Physical: Patient here for a comprehensive physical exam.The patient reports  joint pain due to no longer taking estrogen. Do you take any herbs or supplements that were not prescribed by a doctor? no Are you taking calcium supplements? Yes. Are you taking aspirin daily? yes  Encounter for general adult medical examination without abnormal findings  Physical ("At Risk" items are starred): Patient's last physical exam was 1 year ago .  Patient is not afflicted from Stress Incontinence and Urge Incontinence  Patient wears a seat belts Patient has smoke detectors and has carbon monoxide detectors. Patient practices appropriate gun safety. Patient wears sunscreen with extended sun exposure. Dental Care: biannual cleanings, brushes and flosses daily. Ophthalmology/Optometry: Annual visit.  Hearing loss: none Vision impairments: Glasses  Menarche: 62 yo Menstrual History: reg LMP: S/P menopause Pregnancy history: 0 Safe at home: yes Self breast exams: yes  States she was taken off of her estrogen and progesterone after being off of it for 8 years, in 2023.  States she had acne which cleared up after stopping the meds. But her joints- wrist, shoulders started hurting after she came off of estrogen- progesterone.  Has been using an OTC estrogen cream which seem to help some with her arthralgias but she continues to report hot flashes at night       09/08/2021    2:11 PM 01/20/2021   10:05 PM 12/02/2020    9:00 AM 10/03/2019    8:52 AM  Depression screen PHQ 2/9  Decreased Interest 0 0 0 0  Down, Depressed, Hopeless 0 0 0 0  PHQ - 2 Score 0 0 0 0         12/02/2020    8:59 AM  Fall Risk  Falls in the past year? 0  Was there an injury with Fall? 0  Fall Risk Category Calculator 0  Fall Risk Category  (Retired) Low  (RETIRED) Patient Fall Risk Level Low fall risk  Patient at Risk for Falls Due to No Fall Risks  Fall risk Follow up Falls evaluation completed             Social Hx   Social History   Socioeconomic History   Marital status: Married    Spouse name: Not on file   Number of children: Not on file   Years of education: Not on file   Highest education level: Not on file  Occupational History   Occupation: Art gallery manager  Tobacco Use   Smoking status: Never   Smokeless tobacco: Never  Vaping Use   Vaping status: Never Used  Substance and Sexual Activity   Alcohol use: Yes    Alcohol/week: 0.0 standard drinks of alcohol    Comment: occ-rare   Drug use: Never   Sexual activity: Not on file  Other Topics Concern   Not on file  Social History Narrative   Not on file   Social Determinants of Health   Financial Resource Strain: Low Risk  (09/08/2021)   Overall Financial Resource Strain (CARDIA)    Difficulty of Paying Living Expenses: Not hard at all  Food Insecurity: No Food Insecurity (09/08/2021)   Hunger Vital Sign    Worried About Running Out of Food in the Last Year: Never true    Ran Out of Food in  the Last Year: Never true  Transportation Needs: No Transportation Needs (09/08/2021)   PRAPARE - Administrator, Civil Service (Medical): No    Lack of Transportation (Non-Medical): No  Physical Activity: Insufficiently Active (09/08/2021)   Exercise Vital Sign    Days of Exercise per Week: 4 days    Minutes of Exercise per Session: 30 min  Stress: No Stress Concern Present (09/08/2021)   [employer] of Occupational Health - Occupational Stress Questionnaire    Feeling of Stress : Not at all  Social Connections: Socially Integrated (09/08/2021)   Social Connection and Isolation Panel [NHANES]    Frequency of Communication with Friends and Family: More than three times a week    Frequency of Social Gatherings with Friends and Family: More than three  times a week    Attends Religious Services: More than 4 times per year    Active Member of [employer] or Organizations: Yes    Attends Banker Meetings: 1 to 4 times per year    Marital Status: Married   Past Medical History:  Diagnosis Date   Allergy    Arthritis    Dysmenorrhea    GERD (gastroesophageal reflux disease)    History of tubal ligation 05/30/2013   Overview:  Essure @ 8/13.   Migraine    Ovarian cyst    TMJ (dislocation of temporomandibular joint)    Past Surgical History:  Procedure Laterality Date   BACK SURGERY  2018   CHOLECYSTECTOMY     ENDOMETRIAL ABLATION  09/25/2011   KNEE SURGERY      Family History  Problem Relation Age of Onset   Hypertension Mother    Fibroids Mother    Kidney failure Mother    Chronic Renal Failure Mother    Arrhythmia Mother        A-Fib   Hypertension Father    Congestive Heart Failure Father    Arrhythmia Father    Ovarian cancer Maternal Grandmother    Osteoarthritis Other     Review of Systems  Constitutional:  Positive for diaphoresis (Night sweats). Negative for chills, fatigue and fever.  HENT:  Negative for congestion, ear pain, rhinorrhea and sore throat.   Respiratory:  Negative for cough and shortness of breath.   Cardiovascular:  Negative for chest pain.  Gastrointestinal:  Negative for abdominal pain, constipation, diarrhea, nausea and vomiting.  Genitourinary:  Negative for dysuria and urgency.  Musculoskeletal:  Positive for arthralgias. Negative for back pain and myalgias.  Neurological:  Negative for dizziness, weakness, light-headedness and headaches.  Psychiatric/Behavioral:  Negative for dysphoric mood. The patient is not nervous/anxious.      Objective:  BP 130/80   Pulse 78   Temp (!) 97.1 F (36.2 C)   Ht 5\' 2"  (1.575 m)   Wt 161 lb (73 kg)   SpO2 97%   BMI 29.45 kg/m      10/15/2022   10:38 AM 09/08/2021    2:06 PM 07/22/2021    9:33 AM  BP/Weight  Systolic BP 130 110 122   Diastolic BP 80 68 64  Wt. (Lbs) 161 159 160  BMI 29.45 kg/m2 29.08 kg/m2 29.26 kg/m2    Physical Exam Vitals reviewed.  Constitutional:      Appearance: Normal appearance.  HENT:     Right Ear: Tympanic membrane normal.     Left Ear: Tympanic membrane normal.     Nose: Nose normal.     Mouth/Throat:  Pharynx: No oropharyngeal exudate or posterior oropharyngeal erythema.  Eyes:     Conjunctiva/sclera: Conjunctivae normal.  Neck:     Vascular: No carotid bruit.  Cardiovascular:     Rate and Rhythm: Normal rate and regular rhythm.     Pulses: Normal pulses.     Heart sounds: Normal heart sounds.  Pulmonary:     Effort: Pulmonary effort is normal.     Breath sounds: Normal breath sounds.  Abdominal:     General: Bowel sounds are normal.     Palpations: There is no mass.     Tenderness: There is no abdominal tenderness.  Musculoskeletal:     Cervical back: Normal range of motion.  Skin:    Findings: No lesion.  Neurological:     Mental Status: She is alert and oriented to person, place, and time.  Psychiatric:        Mood and Affect: Mood normal.        Behavior: Behavior normal.     Lab Results  Component Value Date   WBC 6.5 07/22/2021   HGB 13.1 07/22/2021   HCT 39.5 07/22/2021   PLT 266 07/22/2021   GLUCOSE 94 07/22/2021   CHOL 176 07/22/2021   TRIG 82 07/22/2021   HDL 63 07/22/2021   LDLCALC 98 07/22/2021   ALT 12 07/22/2021   AST 16 07/22/2021   NA 139 07/22/2021   K 4.9 07/22/2021   CL 106 07/22/2021   CREATININE 0.67 07/22/2021   BUN 9 07/22/2021   CO2 21 07/22/2021   TSH 2.310 10/03/2019      Assessment & Plan:  Routine medical exam Assessment & Plan: Annual physical exam done Mammogram ordered today First dose of Shingrix vaccine given in office Age-appropriate aspiratory guidance provided Return for wellness exam in 1 year  Orders: -     CBC with Differential/Platelet -     Comprehensive metabolic panel -     Lipid  panel  Encounter for screening mammogram for malignant neoplasm of breast Assessment & Plan: Ordered screening mammogram  Orders: -     Digital Screening Mammogram, Left and Right; Future  Immunization due Assessment & Plan: First dose of Shingrix vaccine given in office. She will return for next dose in 2 months from now  Orders: -     Varicella-zoster vaccine IM  Hot flashes due to menopause Assessment & Plan: She has already tried combination hormone replacement therapy Not keen on going back on it, though she has been using OTC estrogen cream  Has tried and failed Effexor as it made her have "flat affect" Will trial GABAPENTIN 100 mg  at bedtime to help with her hot flashes and arthralgias. Sent 90 caps to her pharmacy. She could increase the dose if one pill does not help enough in 1-2 weeks.   Orders: -     CBC with Differential/Platelet -     Gabapentin; Take 1 capsule (100 mg total) by mouth at bedtime.  Dispense: 90 capsule; Refill: 0  Benign essential HTN Assessment & Plan: Blood pressure currently well-controlled with lisinopril 10 mg daily Ordered metabolic profile to check on kidney function   Orders: -     CBC with Differential/Platelet -     Comprehensive metabolic panel  Screening for lipid disorders -     Lipid panel -     Varicella-zoster vaccine IM     Body mass index is 29.45 kg/m.   These are the goals we discussed:  Goals  None      This is a list of the screening recommended for you and due dates:  Health Maintenance  Topic Date Due   COVID-19 Vaccine (3 - 2023-24 season) 11/07/2021   Mammogram  09/23/2022   Flu Shot  10/08/2022   Zoster (Shingles) Vaccine (2 of 2) 12/10/2022   Pap Smear  09/09/2026   DTaP/Tdap/Td vaccine (2 - Td or Tdap) 07/29/2027   Colon Cancer Screening  07/20/2030   Hepatitis C Screening  Completed   HIV Screening  Completed   HPV Vaccine  Aged Out     Meds ordered this encounter  Medications    gabapentin (NEURONTIN) 100 MG capsule    Sig: Take 1 capsule (100 mg total) by mouth at bedtime.    Dispense:  90 capsule    Refill:  0    Follow-up: Return in about 1 year (around 10/15/2023) for cpe.  An After Visit Summary was printed and given to the patient.  Windell Moment, MD [facility] [phone]

## 2022-10-15 NOTE — Assessment & Plan Note (Signed)
First dose of Shingrix vaccine given in office. She will return for next dose in 2 months from now

## 2022-11-18 ENCOUNTER — Other Ambulatory Visit: Payer: Self-pay

## 2022-11-18 ENCOUNTER — Ambulatory Visit
Admission: RE | Admit: 2022-11-18 | Discharge: 2022-11-18 | Disposition: A | Discharge status: Home / Self Care | Payer: Managed Care, Other (non HMO) | Source: Ambulatory Visit

## 2022-11-18 DIAGNOSIS — Z13220 Encounter for screening for lipoid disorders: Secondary | ICD-10-CM

## 2022-11-18 DIAGNOSIS — Z0000 Encounter for general adult medical examination without abnormal findings: Secondary | ICD-10-CM

## 2022-11-18 DIAGNOSIS — Z1231 Encounter for screening mammogram for malignant neoplasm of breast: Secondary | ICD-10-CM

## 2022-11-18 DIAGNOSIS — I10 Essential (primary) hypertension: Secondary | ICD-10-CM

## 2022-11-18 DIAGNOSIS — Z23 Encounter for immunization: Secondary | ICD-10-CM

## 2022-11-18 DIAGNOSIS — N951 Menopausal and female climacteric states: Secondary | ICD-10-CM

## 2022-11-30 ENCOUNTER — Other Ambulatory Visit: Payer: Self-pay | Admitting: Family Medicine

## 2022-12-06 ENCOUNTER — Other Ambulatory Visit: Payer: Self-pay

## 2022-12-06 DIAGNOSIS — N951 Menopausal and female climacteric states: Secondary | ICD-10-CM

## 2022-12-11 ENCOUNTER — Other Ambulatory Visit: Payer: Self-pay

## 2022-12-11 DIAGNOSIS — N951 Menopausal and female climacteric states: Secondary | ICD-10-CM

## 2022-12-14 ENCOUNTER — Telehealth: Payer: Self-pay

## 2022-12-14 ENCOUNTER — Other Ambulatory Visit: Payer: Self-pay

## 2022-12-14 DIAGNOSIS — N951 Menopausal and female climacteric states: Secondary | ICD-10-CM

## 2022-12-14 MED ORDER — GABAPENTIN 100 MG PO CAPS: 200.0000 mg | ORAL_CAPSULE | Freq: Every day | ORAL | 0 refills | Status: DC

## 2022-12-14 NOTE — Telephone Encounter (Signed)
Patient called stating that the Gabapentin you had given her is helping with her Hot flashes. She states that she does feel like it does need to go up to 200 mg on the gabapentin if you could. Patient is needing refill but wanted to mention possibly increasing the dosage. Please advise.

## 2022-12-14 NOTE — Telephone Encounter (Signed)
Left detailed message informing patient of medication increase.

## 2022-12-17 ENCOUNTER — Ambulatory Visit: Payer: Managed Care, Other (non HMO)

## 2022-12-25 ENCOUNTER — Ambulatory Visit (INDEPENDENT_AMBULATORY_CARE_PROVIDER_SITE_OTHER): Payer: Managed Care, Other (non HMO)

## 2022-12-25 DIAGNOSIS — Z23 Encounter for immunization: Secondary | ICD-10-CM

## 2022-12-25 NOTE — Progress Notes (Signed)
Patient is in office today for a nurse visit for  Shingrix vaccine . Patient Injection was given in the  Left deltoid. Patient tolerated injection well.

## 2023-03-01 ENCOUNTER — Other Ambulatory Visit: Payer: Self-pay

## 2023-03-11 ENCOUNTER — Other Ambulatory Visit: Payer: Self-pay

## 2023-03-11 DIAGNOSIS — N951 Menopausal and female climacteric states: Secondary | ICD-10-CM

## 2023-04-08 ENCOUNTER — Telehealth (INDEPENDENT_AMBULATORY_CARE_PROVIDER_SITE_OTHER): Payer: Managed Care, Other (non HMO)

## 2023-04-08 VITALS — BP 127/71 | HR 69 | Temp 96.1°F | Ht 62.0 in | Wt 160.0 lb

## 2023-04-08 DIAGNOSIS — K529 Noninfective gastroenteritis and colitis, unspecified: Secondary | ICD-10-CM | POA: Diagnosis not present

## 2023-04-08 NOTE — Progress Notes (Signed)
Virtual Visit via Video Note   This visit type was conducted per patient request This format is felt to be appropriate for this patient at this time.  All issues noted in this document were discussed and addressed.  A limited physical exam was performed with this format.  A verbal consent was obtained for the virtual visit.   Date:  04/08/2023   ID:  Julie Thornton, DOB June 18, 1960, MRN [id number]  Patient Location: Home Provider Location: Office/Clinic  PCP:  Windell Moment, MD   Chief Complaint: vomiting, nausea, diarrhea. Patient states she vomited al off Tuesday, none Wednesday. Patient also states she has been super gas, and stated it was horrible. Patient states she still has diarrhea, and that her husband has her drinking pedialyte , has had a pint of it this morning.   Discussed the use of AI scribe software for clinical note transcription with the patient, who gave verbal consent to proceed.       History of Present Illness:    Julie Thornton is a 63 y.o. female  No chief complaint on file. . The patient presents with gastrointestinal symptoms including diarrhea and nausea.  She has been experiencing gastrointestinal symptoms, including diarrhea and nausea, for 3 days. No fever or abdominal pain is present now, but she reported abdominal pain yesterday. She has not observed any blood in her stools. She is taking Pepto-Bismol, which can alter stool color to black, which she noticed.   Her husband is not experiencing similar symptoms, suggesting the illness may not be contagious within her household. No respiratory symptoms such as cough or runny nose are present.  She is managing her symptoms by staying hydrated and consuming bland foods like bread, toast, and oatmeal. The patient does not have symptoms concerning for COVID-19 infection (fever, chills, cough, or new shortness of breath).    Past Medical History:  Diagnosis Date   Allergy    Arthritis    Dysmenorrhea     GERD (gastroesophageal reflux disease)    History of tubal ligation 05/30/2013   Overview:  Essure @ 8/13.   Migraine    Ovarian cyst    TMJ (dislocation of temporomandibular joint)     Past Surgical History:  Procedure Laterality Date   BACK SURGERY  2018   CHOLECYSTECTOMY     ENDOMETRIAL ABLATION  09/25/2011   KNEE SURGERY      Family History  Problem Relation Age of Onset   Hypertension Mother    Fibroids Mother    Kidney failure Mother    Chronic Renal Failure Mother    Arrhythmia Mother        A-Fib   Hypertension Father    Congestive Heart Failure Father    Arrhythmia Father    Ovarian cancer Maternal Grandmother    Osteoarthritis Other    Breast cancer Neg Hx     Social History   Socioeconomic History   Marital status: Married    Spouse name: Not on file   Number of children: Not on file   Years of education: Not on file   Highest education level: Not on file  Occupational History   Occupation: Art gallery manager  Tobacco Use   Smoking status: Never   Smokeless tobacco: Never  Vaping Use   Vaping status: Never Used  Substance and Sexual Activity   Alcohol use: Yes    Alcohol/week: 0.0 standard drinks of alcohol    Comment: occ-rare   Drug use: Never   Sexual activity: Not  on file  Other Topics Concern   Not on file  Social History Narrative   Not on file   Social Drivers of Health   Financial Resource Strain: Low Risk  (09/08/2021)   Overall Financial Resource Strain (CARDIA)    Difficulty of Paying Living Expenses: Not hard at all  Food Insecurity: No Food Insecurity (09/08/2021)   Hunger Vital Sign    Worried About Running Out of Food in the Last Year: Never true    Ran Out of Food in the Last Year: Never true  Transportation Needs: No Transportation Needs (09/08/2021)   PRAPARE - Administrator, Civil Service (Medical): No    Lack of Transportation (Non-Medical): No  Physical Activity: Insufficiently Active (09/08/2021)   Exercise Vital Sign     Days of Exercise per Week: 4 days    Minutes of Exercise per Session: 30 min  Stress: No Stress Concern Present (09/08/2021)   [employer] of Occupational Health - Occupational Stress Questionnaire    Feeling of Stress : Not at all  Social Connections: Socially Integrated (09/08/2021)   Social Connection and Isolation Panel [NHANES]    Frequency of Communication with Friends and Family: More than three times a week    Frequency of Social Gatherings with Friends and Family: More than three times a week    Attends Religious Services: More than 4 times per year    Active Member of [employer] or Organizations: Yes    Attends Banker Meetings: 1 to 4 times per year    Marital Status: Married  Catering manager Violence: Not At Risk (09/08/2021)   Humiliation, Afraid, Rape, and Kick questionnaire    Fear of Current or Ex-Partner: No    Emotionally Abused: No    Physically Abused: No    Sexually Abused: No    Outpatient Medications Prior to Visit  Medication Sig Dispense Refill   aspirin EC 81 MG tablet Take 1 tablet (81 mg total) by mouth daily. 90 tablet 3   Calcium Carbonate-Vit D-Min (CALCIUM 1200 PO) Take by mouth.     cetirizine (ZYRTEC) 5 MG tablet Take 10 mg by mouth daily as needed.      fluticasone (FLONASE) 50 MCG/ACT nasal spray Place 1 spray into both nostrils daily as needed.      gabapentin (NEURONTIN) 100 MG capsule TAKE 2 CAPSULES BY MOUTH AT BEDTIME 180 capsule 0   lisinopril (ZESTRIL) 10 MG tablet Take 1 tablet by mouth once daily 90 tablet 0   metroNIDAZOLE (METROCREAM) 0.75 % cream Apply topically 2 (two) times daily. 1 g 0   Multiple Vitamins-Minerals (THERA-M) TABS Take 1 tablet by mouth daily.      omeprazole (PRILOSEC) 20 MG capsule Take 20 mg by mouth daily.     No facility-administered medications prior to visit.    Allergies  Allergen Reactions   Erythromycin Other (See Comments)    Chest pain   Penicillins Rash     Social History   Tobacco  Use   Smoking status: Never   Smokeless tobacco: Never  Vaping Use   Vaping status: Never Used  Substance Use Topics   Alcohol use: Yes    Alcohol/week: 0.0 standard drinks of alcohol    Comment: occ-rare   Drug use: Never     Review of Systems  Constitutional:  Negative for chills and fever.  HENT:  Negative for congestion, sinus pain and sore throat.   Respiratory:  Negative for cough and shortness  of breath.   Cardiovascular:  Negative for chest pain.  Gastrointestinal:  Positive for diarrhea, nausea and vomiting.  Neurological:  Negative for headaches.     Labs/Other Tests and Data Reviewed:    Recent Labs: 10/15/2022: ALT 14; BUN 13; Creatinine, Ser 0.71; Hemoglobin 12.4; Platelets 273; Potassium 4.8; Sodium 142   Recent Lipid Panel Lab Results  Component Value Date/Time   CHOL 185 10/15/2022 11:41 AM   TRIG 74 10/15/2022 11:41 AM   HDL 63 10/15/2022 11:41 AM   CHOLHDL 2.9 10/15/2022 11:41 AM   LDLCALC 108 (H) 10/15/2022 11:41 AM    Wt Readings from Last 3 Encounters:  04/08/23 160 lb (72.6 kg)  10/15/22 161 lb (73 kg)  09/08/21 159 lb (72.1 kg)     Objective:    Vital Signs:  BP 127/71   Pulse 69   Temp (!) 96.1 F (35.6 C)   Ht 5\' 2"  (1.575 m)   Wt 160 lb (72.6 kg)   BMI 29.26 kg/m    Physical Exam Vitals reviewed.  Constitutional:      Appearance: Normal appearance.  Neurological:     Mental Status: She is alert.      ASSESSMENT & PLAN:   Gastroenteritis, acute Assessment & Plan: Acute onset of gastrointestinal symptoms including diarrhea and nausea, likely viral in etiology given the absence of fever and respiratory symptoms.  Symptoms started 3 days ago. No blood in stools noted. Pepto-Bismol taken, which may cause black stools. Norovirus gastroenteritis suspected, currently prevalent.   Discussed the importance of hydration with fluids containing electrolytes and a bland diet. Recommended probiotics to restore gut bacteria. Informed about  the need for medical evaluation if symptoms do not improve or worsen over the weekend. - Advise hydration with electrolyte-containing fluids (e.g., Gatorade) - Recommend bland diet (e.g., bread, toast, oatmeal) - Suggest probiotics (e.g., Floragen) - Advise to avoid Imodium unless diarrhea is severe - Instruct to seek medical evaluation if symptoms persist or worsen over the weekend - Advise to return to the office on Monday if symptoms do not improve for possible blood work and stool testing - Recommend good hygiene practices (e.g., hand washing, disinfecting surfaces) - Offer anti-nausea medication if needed  Follow-up - Return to the office on Monday if symptoms do not improve - Seek emergency evaluation over the weekend if symptoms worsen for imaging and blood work.      No orders of the defined types were placed in this encounter.    No orders of the defined types were placed in this encounter.   I spent < 21 > minutes dedicated to the care of this patient on the date of this encounter to include face-to-face time with the patient.  Follow Up:  In Person prn  Signed, Windell Moment, MD  04/08/2023 11:44 AM    [facility]

## 2023-04-08 NOTE — Assessment & Plan Note (Signed)
Acute onset of gastrointestinal symptoms including diarrhea and nausea, likely viral in etiology given the absence of fever and respiratory symptoms.  Symptoms started 3 days ago. No blood in stools noted. Pepto-Bismol taken, which may cause black stools. Norovirus gastroenteritis suspected, currently prevalent.   Discussed the importance of hydration with fluids containing electrolytes and a bland diet. Recommended probiotics to restore gut bacteria. Informed about the need for medical evaluation if symptoms do not improve or worsen over the weekend. - Advise hydration with electrolyte-containing fluids (e.g., Gatorade) - Recommend bland diet (e.g., bread, toast, oatmeal) - Suggest probiotics (e.g., Floragen) - Advise to avoid Imodium unless diarrhea is severe - Instruct to seek medical evaluation if symptoms persist or worsen over the weekend - Advise to return to the office on Monday if symptoms do not improve for possible blood work and stool testing - Recommend good hygiene practices (e.g., hand washing, disinfecting surfaces) - Offer anti-nausea medication if needed  Follow-up - Return to the office on Monday if symptoms do not improve - Seek emergency evaluation over the weekend if symptoms worsen for imaging and blood work.

## 2023-04-08 NOTE — Progress Notes (Deleted)
Acute Office Visit  Subjective:    Patient ID: Julie Thornton, female    DOB: Oct 22, 1960, 63 y.o.   MRN: [id number]  No chief complaint on file.   Discussed the use of AI scribe software for clinical note transcription with the patient, who gave verbal consent to proceed.      HPI: Patient is in today for vomiting, nausea, diarrhea. Patient states she vomited al off Tuesday, none Wednesday. Patient also states she has been super gas, and stated it was horrible. Patient states she still has diarrhea, and that her husband has her drinking pedialyte , has had a pint of it this morning.   Past Medical History:  Diagnosis Date   Allergy    Arthritis    Dysmenorrhea    GERD (gastroesophageal reflux disease)    History of tubal ligation 05/30/2013   Overview:  Essure @ 8/13.   Migraine    Ovarian cyst    TMJ (dislocation of temporomandibular joint)     Past Surgical History:  Procedure Laterality Date   BACK SURGERY  2018   CHOLECYSTECTOMY     ENDOMETRIAL ABLATION  09/25/2011   KNEE SURGERY      Family History  Problem Relation Age of Onset   Hypertension Mother    Fibroids Mother    Kidney failure Mother    Chronic Renal Failure Mother    Arrhythmia Mother        A-Fib   Hypertension Father    Congestive Heart Failure Father    Arrhythmia Father    Ovarian cancer Maternal Grandmother    Osteoarthritis Other    Breast cancer Neg Hx     Social History   Socioeconomic History   Marital status: Married    Spouse name: Not on file   Number of children: Not on file   Years of education: Not on file   Highest education level: Not on file  Occupational History   Occupation: Art gallery manager  Tobacco Use   Smoking status: Never   Smokeless tobacco: Never  Vaping Use   Vaping status: Never Used  Substance and Sexual Activity   Alcohol use: Yes    Alcohol/week: 0.0 standard drinks of alcohol    Comment: occ-rare   Drug use: Never   Sexual activity: Not on file  Other  Topics Concern   Not on file  Social History Narrative   Not on file   Social Drivers of Health   Financial Resource Strain: Low Risk  (09/08/2021)   Overall Financial Resource Strain (CARDIA)    Difficulty of Paying Living Expenses: Not hard at all  Food Insecurity: No Food Insecurity (09/08/2021)   Hunger Vital Sign    Worried About Running Out of Food in the Last Year: Never true    Ran Out of Food in the Last Year: Never true  Transportation Needs: No Transportation Needs (09/08/2021)   PRAPARE - Administrator, Civil Service (Medical): No    Lack of Transportation (Non-Medical): No  Physical Activity: Insufficiently Active (09/08/2021)   Exercise Vital Sign    Days of Exercise per Week: 4 days    Minutes of Exercise per Session: 30 min  Stress: No Stress Concern Present (09/08/2021)   [employer] of Occupational Health - Occupational Stress Questionnaire    Feeling of Stress : Not at all  Social Connections: Socially Integrated (09/08/2021)   Social Connection and Isolation Panel [NHANES]    Frequency of Communication with Friends and Family:  More than three times a week    Frequency of Social Gatherings with Friends and Family: More than three times a week    Attends Religious Services: More than 4 times per year    Active Member of Clubs or Organizations: Yes    Attends Banker Meetings: 1 to 4 times per year    Marital Status: Married  Catering manager Violence: Not At Risk (09/08/2021)   Humiliation, Afraid, Rape, and Kick questionnaire    Fear of Current or Ex-Partner: No    Emotionally Abused: No    Physically Abused: No    Sexually Abused: No    Outpatient Medications Prior to Visit  Medication Sig Dispense Refill   aspirin EC 81 MG tablet Take 1 tablet (81 mg total) by mouth daily. 90 tablet 3   Calcium Carbonate-Vit D-Min (CALCIUM 1200 PO) Take by mouth.     cetirizine (ZYRTEC) 5 MG tablet Take 10 mg by mouth daily as needed.       fluticasone (FLONASE) 50 MCG/ACT nasal spray Place 1 spray into both nostrils daily as needed.      gabapentin (NEURONTIN) 100 MG capsule TAKE 2 CAPSULES BY MOUTH AT BEDTIME 180 capsule 0   lisinopril (ZESTRIL) 10 MG tablet Take 1 tablet by mouth once daily 90 tablet 0   metroNIDAZOLE (METROCREAM) 0.75 % cream Apply topically 2 (two) times daily. 1 g 0   Multiple Vitamins-Minerals (THERA-M) TABS Take 1 tablet by mouth daily.      omeprazole (PRILOSEC) 20 MG capsule Take 20 mg by mouth daily.     No facility-administered medications prior to visit.    Allergies  Allergen Reactions   Erythromycin Other (See Comments)    Chest pain   Penicillins Rash    Review of Systems  Constitutional:  Negative for chills, fatigue and fever.  HENT:  Negative for congestion, ear pain and sinus pain.   Respiratory:  Negative for cough and shortness of breath.   Cardiovascular:  Negative for chest pain.  Gastrointestinal:  Positive for diarrhea, nausea and vomiting. Negative for abdominal pain and constipation.  Musculoskeletal:  Negative for myalgias.  Neurological:  Negative for headaches.       Objective:        10/15/2022   10:38 AM 09/08/2021    2:06 PM 07/22/2021    9:33 AM  Vitals with BMI  Height 5\' 2"  5\' 2"  5\' 2"   Weight 161 lbs 159 lbs 160 lbs  BMI 29.44 29.07 29.26  Systolic 130 110 132  Diastolic 80 68 64  Pulse 78 72 74    No data found.   Physical Exam  Health Maintenance Due  Topic Date Due   INFLUENZA VACCINE  10/08/2022   COVID-19 Vaccine (3 - 2024-25 season) 11/08/2022    There are no preventive care reminders to display for this patient.   Lab Results  Component Value Date   TSH 2.310 10/03/2019   Lab Results  Component Value Date   WBC 5.3 10/15/2022   HGB 12.4 10/15/2022   HCT 39.1 10/15/2022   MCV 82 10/15/2022   PLT 273 10/15/2022   Lab Results  Component Value Date   NA 142 10/15/2022   K 4.8 10/15/2022   CO2 24 10/15/2022   GLUCOSE 80  10/15/2022   BUN 13 10/15/2022   CREATININE 0.71 10/15/2022   BILITOT 0.4 10/15/2022   ALKPHOS 142 (H) 10/15/2022   AST 16 10/15/2022   ALT 14 10/15/2022  PROT 5.9 (L) 10/15/2022   ALBUMIN 4.1 10/15/2022   CALCIUM 9.2 10/15/2022   EGFR 97 10/15/2022   Lab Results  Component Value Date   CHOL 185 10/15/2022   Lab Results  Component Value Date   HDL 63 10/15/2022   Lab Results  Component Value Date   LDLCALC 108 (H) 10/15/2022   Lab Results  Component Value Date   TRIG 74 10/15/2022   Lab Results  Component Value Date   CHOLHDL 2.9 10/15/2022   No results found for: "HGBA1C"     Assessment & Plan:  There are no diagnoses linked to this encounter.   No orders of the defined types were placed in this encounter.   No orders of the defined types were placed in this encounter.    Follow-up: No follow-ups on file.  An After Visit Summary was printed and given to the patient.  Windell Moment, MD [facility] [phone]

## 2023-05-23 ENCOUNTER — Other Ambulatory Visit: Payer: Self-pay

## 2023-05-31 ENCOUNTER — Other Ambulatory Visit: Payer: Self-pay

## 2023-05-31 MED ORDER — LISINOPRIL 10 MG PO TABS: 10.0000 mg | ORAL_TABLET | Freq: Every day | ORAL | 0 refills | Status: DC

## 2023-07-05 ENCOUNTER — Other Ambulatory Visit: Payer: Self-pay

## 2023-07-05 DIAGNOSIS — N951 Menopausal and female climacteric states: Secondary | ICD-10-CM

## 2023-07-07 ENCOUNTER — Other Ambulatory Visit: Payer: Self-pay

## 2023-07-07 DIAGNOSIS — N951 Menopausal and female climacteric states: Secondary | ICD-10-CM

## 2023-07-07 MED ORDER — GABAPENTIN 100 MG PO CAPS: 200.0000 mg | ORAL_CAPSULE | Freq: Every day | ORAL | 0 refills | Status: AC

## 2023-07-07 NOTE — Telephone Encounter (Signed)
 Copied from CRM [id number]. Topic: Clinical - Medication Refill >> Jul 07, 2023 11:07 AM Annice Kim wrote: Most Recent Primary Care Visit:  Provider: SIRIVOL, MAMATHA  Department: [facility]  Visit Type: OFFICE VISIT  Date: 04/08/2023  Medication: gabapentin  (NEURONTIN ) 100 MG capsule [[id number]]  Has the patient contacted their pharmacy? Yes (Agent: If no, request that the patient contact the pharmacy for the refill. If patient does not wish to contact the pharmacy document the reason why and proceed with request.) (Agent: If yes, when and what did the pharmacy advise?)  Is this the correct pharmacy for this prescription? Yes If no, delete pharmacy and type the correct one.  This is the patient's preferred pharmacy:  [facility] [address], [place] - [address] [place] [address] Phone: [phone] Fax: [phone]   Has the prescription been filled recently? Yes  Is the patient out of the medication? Yes  Has the patient been seen for an appointment in the last year OR does the patient have an upcoming appointment? Yes  Can we respond through MyChart? No   Agent: Please be advised that Rx refills may take up to 3 business days. We ask that you follow-up with your pharmacy.

## 2023-08-18 ENCOUNTER — Other Ambulatory Visit: Payer: Self-pay

## 2023-08-18 MED ORDER — LISINOPRIL 10 MG PO TABS: 10.0000 mg | ORAL_TABLET | Freq: Every day | ORAL | 0 refills | Status: DC

## 2023-08-18 NOTE — Telephone Encounter (Signed)
 Copied from CRM [id number]. Topic: Clinical - Medication Refill >> Aug 18, 2023 12:57 PM Sasha H wrote: Medication:lisinopril  (ZESTRIL ) 10 MG tablet   Has the patient contacted their pharmacy? Yes (Agent: If no, request that the patient contact the pharmacy for the refill. If patient does not wish to contact the pharmacy document the reason why and proceed with request.) (Agent: If yes, when and what did the pharmacy advise?)  This is the patient's preferred pharmacy:  [facility] [address] [place] - [address] [place] [address] Phone: [phone] Fax: [phone]  Is this the correct pharmacy for this prescription? Yes If no, delete pharmacy and type the correct one.   Has the prescription been filled recently? Yes  Is the patient out of the medication? Yes  Has the patient been seen for an appointment in the last year OR does the patient have an upcoming appointment? Yes  Can we respond through MyChart? No  Agent: Please be advised that Rx refills may take up to 3 business days. We ask that you follow-up with your pharmacy.

## 2023-10-21 ENCOUNTER — Encounter: Payer: Self-pay | Admitting: Family Medicine

## 2023-10-21 ENCOUNTER — Ambulatory Visit (INDEPENDENT_AMBULATORY_CARE_PROVIDER_SITE_OTHER): Payer: Managed Care, Other (non HMO) | Admitting: Family Medicine

## 2023-10-21 ENCOUNTER — Ambulatory Visit: Payer: Self-pay | Admitting: Family Medicine

## 2023-10-21 DIAGNOSIS — Z1231 Encounter for screening mammogram for malignant neoplasm of breast: Secondary | ICD-10-CM | POA: Diagnosis not present

## 2023-10-21 DIAGNOSIS — Z0000 Encounter for general adult medical examination without abnormal findings: Secondary | ICD-10-CM

## 2023-10-21 DIAGNOSIS — R3915 Urgency of urination: Secondary | ICD-10-CM

## 2023-10-21 DIAGNOSIS — R051 Acute cough: Secondary | ICD-10-CM

## 2023-10-21 LAB — POCT URINALYSIS DIP (CLINITEK)
Bilirubin, UA: NEGATIVE
Blood, UA: NEGATIVE
Glucose, UA: NEGATIVE mg/dL
Ketones, POC UA: NEGATIVE mg/dL
Nitrite, UA: NEGATIVE
Spec Grav, UA: 1.01 (ref 1.010–1.025)
Urobilinogen, UA: 0.2 U/dL
pH, UA: 7 (ref 5.0–8.0)

## 2023-10-21 LAB — POC COVID19 BINAXNOW: SARS Coronavirus 2 Ag: NEGATIVE

## 2023-10-21 LAB — POCT INFLUENZA A/B
Influenza A, POC: NEGATIVE
Influenza B, POC: NEGATIVE

## 2023-10-21 MED ORDER — CIPROFLOXACIN HCL 250 MG PO TABS: 250.0000 mg | ORAL_TABLET | Freq: Two times a day (BID) | ORAL | 0 refills | Status: AC

## 2023-10-21 NOTE — Patient Instructions (Signed)
 If you have COVID you should remain isolated and quarantine until you are feeling better and are fever free without any fever reducers for at least 24 hours. You should wear a mask at all times after leaving isolation for the equivalent of 10 days from the onset of symptoms.  If you feel worse or have increasing shortness of breath, you should be seen in person at urgent care or the emergency room.   Rx: paxlovid  or molnupiravir. Recommend rest, fluids, 3 meals per day.  Fever reducing medicines.  OTC cold and congestion medicines.     Things to do to keep yourself healthy  - Exercise at least 30-45 minutes a day, 3-4 days a week.  - Eat a low-fat diet with lots of fruits and vegetables, up to 7-9 servings per day.  - Seatbelts can save your life. Wear them always.  - Smoke detectors on every level of your home, check batteries every year.  - Eye Doctor - have an eye exam every 1-2 years  - Safe sex - if you may be exposed to STDs, use a condom.  - Alcohol -  If you drink, do it moderately, less than 2 drinks per day.  - Health Care Power of Attorney. Choose someone to speak for you if you are not able.  - Depression is common in our stressful world.If you're feeling down or losing interest in things you normally enjoy, please come in for a visit.  - Violence - If anyone is threatening or hurting you, please call immediately.  Cipro  for urinary tract infection

## 2023-10-21 NOTE — Progress Notes (Signed)
 Complete physical exam  Patient: Julie Thornton   DOB: 1960-09-23   63 y.o. Female  MRN: [id number]  Subjective:    Chief Complaint  Patient presents with   Annual Exam    +    Ellarose Calais is a 63 y.o. female who presents today for a complete physical exam. She reports consuming a low fat diet. The patient does not participate in regular exercise at present. She generally feels well. She reports sleeping well. She does have additional problems to discuss today.    Encounter for general adult medical examination without abnormal findings  Physical (At Risk items are starred): Patient's last physical exam was 1 year ago .  Patient is not afflicted from Stress Incontinence and Urge Incontinence  Patient wears a seat belts Patient has smoke detectors and has carbon monoxide detectors. Patient practices appropriate gun safety. Patient wears sunscreen with extended sun exposure. Dental Care: biannual cleanings, brushes and flosses daily. Ophthalmology/Optometry: Annual visit.  Hearing loss: none Vision impairments: Glasses   Menarche: 63 yo Menstrual History: reg LMP: S/P menopause Pregnancy history: 0 Safe at home: yes Self breast exams: yes Most recent fall risk assessment:  Discussed the use of AI scribe software for clinical note transcription with the patient, who gave verbal consent to proceed.  History of Present Illness   Julie Thornton is a 63 year old female who presents for a routine physical exam and COVID-19 testing.  Covid-19 exposure and upper respiratory symptoms - Exposed to COVID-19 this past weekend during her father's visit - Husband developed symptoms yesterday; home COVID test was positive - Experiencing nasal congestion and headache - Dry cough present - No fever, chills, sweats, chest pain, or gastrointestinal symptoms  Urinary urgency and incontinence - Urinary urgency, particularly when away from home - Occasional urinary leakage if unable to reach  the bathroom in time - No history of bladder infections - Requests urine test  Weight loss and lifestyle - Unintentional weight loss of five pounds recently - Focusing on healthy eating - Not exercising regularly but remains active by working outside  General well-being - Generally feels well - Sleeping well          10/21/2023   10:23 AM  Fall Risk   Falls in the past year? 0  Number falls in past yr: 0  Injury with Fall? 0  Risk for fall due to : No Fall Risks  Follow up Falls evaluation completed     Most recent depression screenings:    10/21/2023   10:23 AM 09/08/2021    2:11 PM  PHQ 2/9 Scores  PHQ - 2 Score 0 0    Vision:Within last year  Patient Active Problem List   Diagnosis Date Noted   Gastroenteritis, acute 04/08/2023   Benign essential HTN 10/15/2022   Hot flashes due to menopause 10/15/2022   Immunization due 10/15/2022   Encounter for screening mammogram for malignant neoplasm of breast 10/15/2022   Encounter for screening for HIV 09/09/2021   Encounter for hepatitis C screening test for low risk patient 09/09/2021   Cervical cancer screening 09/09/2021   Peroneal tendinitis 08/11/2021   LAD (lymphadenopathy) of left cervical region 07/23/2021   Mixed hyperlipidemia 07/23/2021   Calcific tendonitis of right foot 07/23/2021   Routine medical exam 02/01/2021   Benign hypertension 12/10/2020   Decreased pulses in feet 12/10/2020   Lumbar back pain with radiculopathy affecting right lower extremity 09/05/2019   Past Medical History:  Diagnosis Date  Allergy    Arthritis    Dysmenorrhea    GERD (gastroesophageal reflux disease)    History of tubal ligation 05/30/2013   Overview:  Essure @ 8/13.   Migraine    Ovarian cyst    TMJ (dislocation of temporomandibular joint)    Past Surgical History:  Procedure Laterality Date   BACK SURGERY  2018   CHOLECYSTECTOMY     ENDOMETRIAL ABLATION  09/25/2011   KNEE SURGERY     Social History    Socioeconomic History   Marital status: Married    Spouse name: Not on file   Number of children: Not on file   Years of education: Not on file   Highest education level: Not on file  Occupational History   Occupation: Art gallery manager  Tobacco Use   Smoking status: Never   Smokeless tobacco: Never  Vaping Use   Vaping status: Never Used  Substance and Sexual Activity   Alcohol use: Yes    Alcohol/week: 0.0 standard drinks of alcohol    Comment: occ-rare   Drug use: Never   Sexual activity: Not on file  Other Topics Concern   Not on file  Social History Narrative   Not on file   Social Drivers of Health   Financial Resource Strain: Low Risk  (09/08/2021)   Overall Financial Resource Strain (CARDIA)    Difficulty of Paying Living Expenses: Not hard at all  Food Insecurity: No Food Insecurity (09/08/2021)   Hunger Vital Sign    Worried About Running Out of Food in the Last Year: Never true    Ran Out of Food in the Last Year: Never true  Transportation Needs: No Transportation Needs (09/08/2021)   PRAPARE - Administrator, Civil Service (Medical): No    Lack of Transportation (Non-Medical): No  Physical Activity: Insufficiently Active (09/08/2021)   Exercise Vital Sign    Days of Exercise per Week: 4 days    Minutes of Exercise per Session: 30 min  Stress: No Stress Concern Present (09/08/2021)   [employer] of Occupational Health - Occupational Stress Questionnaire    Feeling of Stress : Not at all  Social Connections: Socially Integrated (09/08/2021)   Social Connection and Isolation Panel    Frequency of Communication with Friends and Family: More than three times a week    Frequency of Social Gatherings with Friends and Family: More than three times a week    Attends Religious Services: More than 4 times per year    Active Member of [employer] or Organizations: Yes    Attends Banker Meetings: 1 to 4 times per year    Marital Status: Married  Careers information officer Violence: Not At Risk (09/08/2021)   Humiliation, Afraid, Rape, and Kick questionnaire    Fear of Current or Ex-Partner: No    Emotionally Abused: No    Physically Abused: No    Sexually Abused: No   Family Status  Relation Name Status   Mother  Deceased   Father  Alive   MGM  (Not Specified)   Other  (Not Specified)   Neg Hx  (Not Specified)  No partnership data on file   Family History  Problem Relation Age of Onset   Hypertension Mother    Fibroids Mother    Kidney failure Mother    Chronic Renal Failure Mother    Arrhythmia Mother        A-Fib   Hypertension Father    Congestive Heart Failure  Father    Arrhythmia Father    Ovarian cancer Maternal Grandmother    Osteoarthritis Other    Breast cancer Neg Hx    Allergies  Allergen Reactions   Erythromycin Other (See Comments)    Chest pain   Penicillins Rash      Patient Care Team: Sirivol, Mamatha, MD as PCP - General (Family Medicine) Angelica Lonni CROME, MD as Referring Physician (Emergency Medicine)   Outpatient Medications Prior to Visit  Medication Sig   Calcium Carbonate-Vit D-Min (CALCIUM 1200 PO) Take by mouth.   cetirizine (ZYRTEC) 5 MG tablet Take 10 mg by mouth daily as needed.    fluticasone (FLONASE) 50 MCG/ACT nasal spray Place 1 spray into both nostrils daily as needed.    gabapentin  (NEURONTIN ) 100 MG capsule Take 2 capsules (200 mg total) by mouth at bedtime.   lisinopril  (ZESTRIL ) 10 MG tablet Take 1 tablet (10 mg total) by mouth daily.   meloxicam  (MOBIC ) 7.5 MG tablet Take 7.5 mg by mouth daily.   metroNIDAZOLE  (METROCREAM ) 0.75 % cream Apply topically 2 (two) times daily.   Multiple Vitamins-Minerals (THERA-M) TABS Take 1 tablet by mouth daily.    omeprazole  (PRILOSEC) 20 MG capsule Take 20 mg by mouth daily.   YUVAFEM  10 MCG TABS vaginal tablet Take 10 mcg by mouth 2 (two) times a week.   aspirin  EC 81 MG tablet Take 1 tablet (81 mg total) by mouth daily.   No  facility-administered medications prior to visit.    Review of Systems  Constitutional:  Negative for chills, diaphoresis, fever and malaise/fatigue.  HENT:  Positive for congestion. Negative for ear pain and sore throat.   Respiratory:  Positive for cough. Negative for sputum production.   Cardiovascular:  Negative for chest pain and palpitations.  Gastrointestinal:  Negative for abdominal pain, constipation, diarrhea, nausea and vomiting.  Genitourinary:  Positive for urgency. Negative for dysuria.  Musculoskeletal:  Negative for myalgias.  Neurological:  Positive for headaches. Negative for dizziness.  Psychiatric/Behavioral:  Negative for depression. The patient is not nervous/anxious.           Objective:     BP (!) 138/98 (BP Location: Left Arm, Patient Position: Sitting)   Pulse 64   Temp (!) 97.2 F (36.2 C) (Temporal)   Ht 5' 2 (1.575 m)   Wt 157 lb (71.2 kg)   SpO2 98%   BMI 28.72 kg/m  BP Readings from Last 3 Encounters:  10/21/23 (!) 138/98  04/08/23 127/71  10/15/22 130/80      Physical Exam Vitals reviewed.  Constitutional:      General: She is not in acute distress.    Appearance: Normal appearance. She is normal weight.  HENT:     Right Ear: Tympanic membrane and ear canal normal.     Left Ear: Tympanic membrane and ear canal normal.     Nose: Nose normal. No congestion or rhinorrhea.  Eyes:     Conjunctiva/sclera: Conjunctivae normal.  Neck:     Thyroid: No thyroid mass.     Vascular: No carotid bruit.  Cardiovascular:     Rate and Rhythm: Normal rate and regular rhythm.     Pulses: Normal pulses.     Heart sounds: Normal heart sounds. No murmur heard. Pulmonary:     Effort: Pulmonary effort is normal.     Breath sounds: Normal breath sounds.  Abdominal:     General: Bowel sounds are normal.     Palpations: Abdomen is soft. There  is no mass.     Tenderness: There is no abdominal tenderness.  Musculoskeletal:        General: Normal  range of motion.  Lymphadenopathy:     Cervical: No cervical adenopathy.  Skin:    General: Skin is warm and dry.  Neurological:     Mental Status: She is alert and oriented to person, place, and time.     Cranial Nerves: No cranial nerve deficit.  Psychiatric:        Mood and Affect: Mood normal.        Behavior: Behavior normal.      Results for orders placed or performed in visit on 10/21/23  Urine Culture   Specimen: Urine   Urine  Result Value Ref Range   Urine Culture, Routine Final report    Organism ID, Bacteria Comment   CBC with Differential/Platelet  Result Value Ref Range   WBC 5.5 3.4 - 10.8 x10E3/uL   RBC 5.08 3.77 - 5.28 x10E6/uL   Hemoglobin 12.8 11.1 - 15.9 g/dL   Hematocrit 57.8 65.9 - 46.6 %   MCV 83 79 - 97 fL   MCH 25.2 (L) 26.6 - 33.0 pg   MCHC 30.4 (L) 31.5 - 35.7 g/dL   RDW 87.4 88.2 - 84.5 %   Platelets 243 150 - 450 x10E3/uL   Neutrophils 58 Not Estab. %   Lymphs 32 Not Estab. %   Monocytes 6 Not Estab. %   Eos 3 Not Estab. %   Basos 1 Not Estab. %   Neutrophils Absolute 3.1 1.4 - 7.0 x10E3/uL   Lymphocytes Absolute 1.8 0.7 - 3.1 x10E3/uL   Monocytes Absolute 0.3 0.1 - 0.9 x10E3/uL   EOS (ABSOLUTE) 0.2 0.0 - 0.4 x10E3/uL   Basophils Absolute 0.1 0.0 - 0.2 x10E3/uL   Immature Granulocytes 0 Not Estab. %   Immature Grans (Abs) 0.0 0.0 - 0.1 x10E3/uL  Comprehensive metabolic panel with GFR  Result Value Ref Range   Glucose 83 70 - 99 mg/dL   BUN 12 8 - 27 mg/dL   Creatinine, Ser 9.32 0.57 - 1.00 mg/dL   eGFR 99 >40 fO/fpw/8.26   BUN/Creatinine Ratio 18 12 - 28   Sodium 141 134 - 144 mmol/L   Potassium 5.2 3.5 - 5.2 mmol/L   Chloride 105 96 - 106 mmol/L   CO2 22 20 - 29 mmol/L   Calcium 9.6 8.7 - 10.3 mg/dL   Total Protein 6.5 6.0 - 8.5 g/dL   Albumin 4.2 3.9 - 4.9 g/dL   Globulin, Total 2.3 1.5 - 4.5 g/dL   Bilirubin Total 0.4 0.0 - 1.2 mg/dL   Alkaline Phosphatase 156 (H) 44 - 121 IU/L   AST 17 0 - 40 IU/L   ALT 15 0 - 32 IU/L   Lipid panel  Result Value Ref Range   Cholesterol, Total 187 100 - 199 mg/dL   Triglycerides 61 0 - 149 mg/dL   HDL 67 >60 mg/dL   VLDL Cholesterol Cal 11 5 - 40 mg/dL   LDL Chol Calc (NIH) 890 (H) 0 - 99 mg/dL   Chol/HDL Ratio 2.8 0.0 - 4.4 ratio  TSH  Result Value Ref Range   TSH 1.450 0.450 - 4.500 uIU/mL  POCT URINALYSIS DIP (CLINITEK)  Result Value Ref Range   Color, UA yellow yellow   Clarity, UA clear clear   Glucose, UA negative negative mg/dL   Bilirubin, UA negative negative   Ketones, POC UA negative negative mg/dL  Spec Grav, UA 1.010 1.010 - 1.025   Blood, UA negative negative   pH, UA 7.0 5.0 - 8.0   POC PROTEIN,UA trace negative, trace   Urobilinogen, UA 0.2 0.2 or 1.0 E.U./dL   Nitrite, UA Negative Negative   Leukocytes, UA Moderate (2+) (A) Negative  POC COVID-19 BinaxNow  Result Value Ref Range   SARS Coronavirus 2 Ag Negative Negative  POCT Influenza A/B  Result Value Ref Range   Influenza A, POC Negative Negative   Influenza B, POC Negative Negative   Last CBC Lab Results  Component Value Date   WBC 5.5 10/21/2023   HGB 12.8 10/21/2023   HCT 42.1 10/21/2023   MCV 83 10/21/2023   MCH 25.2 (L) 10/21/2023   RDW 12.5 10/21/2023   PLT 243 10/21/2023   Last metabolic panel Lab Results  Component Value Date   GLUCOSE 83 10/21/2023   NA 141 10/21/2023   K 5.2 10/21/2023   CL 105 10/21/2023   CO2 22 10/21/2023   BUN 12 10/21/2023   CREATININE 0.67 10/21/2023   EGFR 99 10/21/2023   CALCIUM 9.6 10/21/2023   PROT 6.5 10/21/2023   ALBUMIN 4.2 10/21/2023   LABGLOB 2.3 10/21/2023   AGRATIO 1.7 07/22/2021   BILITOT 0.4 10/21/2023   ALKPHOS 156 (H) 10/21/2023   AST 17 10/21/2023   ALT 15 10/21/2023   Last lipids Lab Results  Component Value Date   CHOL 187 10/21/2023   HDL 67 10/21/2023   LDLCALC 109 (H) 10/21/2023   TRIG 61 10/21/2023   CHOLHDL 2.8 10/21/2023   Last hemoglobin A1c No results found for: HGBA1C Last thyroid  functions Lab Results  Component Value Date   TSH 1.450 10/21/2023   Last vitamin D No results found for: 25OHVITD2, 25OHVITD3, VD25OH Last vitamin B12 and Folate No results found for: VITAMINB12, FOLATE      Assessment & Plan:    Routine Health Maintenance and Physical Exam  Immunization History  Administered Date(s) Administered   Influenza Inj Mdck Quad Pf 12/02/2020   PFIZER(Purple Top)SARS-COV-2 Vaccination 05/10/2019, 06/06/2019   Tdap 07/28/2017   Zoster Recombinant(Shingrix ) 10/15/2022, 12/25/2022    Health Maintenance  Topic Date Due   Pneumococcal Vaccine: 50+ Years (1 of 1 - PCV) Never done   COVID-19 Vaccine (3 - 2024-25 season) 11/08/2022   INFLUENZA VACCINE  10/08/2023   MAMMOGRAM  11/18/2023   Cervical Cancer Screening (HPV/Pap Cotest)  09/09/2026   DTaP/Tdap/Td (2 - Td or Tdap) 07/29/2027   Colonoscopy  07/20/2030   Hepatitis C Screening  Completed   HIV Screening  Completed   Zoster Vaccines- Shingrix   Completed   Hepatitis B Vaccines 19-59 Average Risk  Aged Out   HPV VACCINES  Aged Out   Meningococcal B Vaccine  Aged Out    Discussed health benefits of physical activity, and encouraged her to engage in regular exercise appropriate for her age and condition.  Problem List Items Addressed This Visit       Active Problems   Encounter for screening mammogram for malignant neoplasm of breast   Routine medical exam   Relevant Orders   CBC with Differential/Platelet (Completed)   Comprehensive metabolic panel with GFR (Completed)   Lipid panel (Completed)   TSH (Completed)   MM DIGITAL SCREENING BILATERAL   Other Visit Diagnoses       Acute cough       Relevant Orders   POC COVID-19 BinaxNow (Completed)   POCT Influenza A/B (Completed)  Urgency of urination       Relevant Orders   POCT URINALYSIS DIP (CLINITEK) (Completed)   Urine Culture (Completed)      Return in about 3 weeks (around 11/11/2023) for BP CHECK and pneumonia  vaccine.   Education given.   I attest that I have reviewed this visit and agree with the plan scribed by my staff.   Abigail Free, MD [facility] [phone]

## 2023-10-22 ENCOUNTER — Telehealth: Payer: Self-pay

## 2023-10-22 LAB — COMPREHENSIVE METABOLIC PANEL WITH GFR
ALT: 15 IU/L (ref 0–32)
AST: 17 IU/L (ref 0–40)
Albumin: 4.2 g/dL (ref 3.9–4.9)
Alkaline Phosphatase: 156 IU/L — ABNORMAL HIGH (ref 44–121)
BUN/Creatinine Ratio: 18 (ref 12–28)
BUN: 12 mg/dL (ref 8–27)
Bilirubin Total: 0.4 mg/dL (ref 0.0–1.2)
CO2: 22 mmol/L (ref 20–29)
Calcium: 9.6 mg/dL (ref 8.7–10.3)
Chloride: 105 mmol/L (ref 96–106)
Creatinine, Ser: 0.67 mg/dL (ref 0.57–1.00)
Globulin, Total: 2.3 g/dL (ref 1.5–4.5)
Glucose: 83 mg/dL (ref 70–99)
Potassium: 5.2 mmol/L (ref 3.5–5.2)
Sodium: 141 mmol/L (ref 134–144)
Total Protein: 6.5 g/dL (ref 6.0–8.5)
eGFR: 99 mL/min/1.73 (ref >59)

## 2023-10-22 LAB — CBC WITH DIFFERENTIAL/PLATELET
Basophils Absolute: 0.1 x10E3/uL (ref 0.0–0.2)
Basos: 1 %
EOS (ABSOLUTE): 0.2 x10E3/uL (ref 0.0–0.4)
Eos: 3 %
Hematocrit: 42.1 % (ref 34.0–46.6)
Hemoglobin: 12.8 g/dL (ref 11.1–15.9)
Immature Grans (Abs): 0 x10E3/uL (ref 0.0–0.1)
Immature Granulocytes: 0 %
Lymphocytes Absolute: 1.8 x10E3/uL (ref 0.7–3.1)
Lymphs: 32 %
MCH: 25.2 pg — ABNORMAL LOW (ref 26.6–33.0)
MCHC: 30.4 g/dL — ABNORMAL LOW (ref 31.5–35.7)
MCV: 83 fL (ref 79–97)
Monocytes Absolute: 0.3 x10E3/uL (ref 0.1–0.9)
Monocytes: 6 %
Neutrophils Absolute: 3.1 x10E3/uL (ref 1.4–7.0)
Neutrophils: 58 %
Platelets: 243 x10E3/uL (ref 150–450)
RBC: 5.08 x10E6/uL (ref 3.77–5.28)
RDW: 12.5 % (ref 11.7–15.4)
WBC: 5.5 x10E3/uL (ref 3.4–10.8)

## 2023-10-22 LAB — LIPID PANEL
Chol/HDL Ratio: 2.8 ratio (ref 0.0–4.4)
Cholesterol, Total: 187 mg/dL (ref 100–199)
HDL: 67 mg/dL (ref >39)
LDL Chol Calc (NIH): 109 mg/dL — ABNORMAL HIGH (ref 0–99)
Triglycerides: 61 mg/dL (ref 0–149)
VLDL Cholesterol Cal: 11 mg/dL (ref 5–40)

## 2023-10-22 LAB — TSH: TSH: 1.45 u[IU]/mL (ref 0.450–4.500)

## 2023-10-22 MED ORDER — NIRMATRELVIR/RITONAVIR (PAXLOVID)TABLET: 3.0000 | ORAL_TABLET | Freq: Two times a day (BID) | ORAL | 0 refills | Status: AC

## 2023-10-22 NOTE — Telephone Encounter (Signed)
 Copied from CRM #[id number]. Topic: Clinical - Medication Question >> Oct 22, 2023  9:04 AM Delon HERO wrote: Reason for CRM: Patient is calling to report that she knows that she has COVID. Husband and father have tested positive. Lab test negative. Patient reporting that Dr. Sherre would sent medication in for her during office visit yesterday.  Symptoms including 100.6, body aches, headache,   Patient would like to make sure that there is no interaction with antibiotic and paxlovid .   [facility] [address], [place] - [id number] [place] [address]Eynwz: [phone] Fax: [phone]: Not open 24 hours  livd

## 2023-10-23 LAB — URINE CULTURE

## 2023-11-11 ENCOUNTER — Ambulatory Visit

## 2023-11-11 VITALS — BP 140/100 | HR 75

## 2023-11-11 DIAGNOSIS — I10 Essential (primary) hypertension: Secondary | ICD-10-CM

## 2023-11-11 MED ORDER — METHOCARBAMOL 500 MG PO TABS: 500.0000 mg | ORAL_TABLET | Freq: Every evening | ORAL | 0 refills | Status: AC | PRN

## 2023-11-11 NOTE — Progress Notes (Signed)
 Patient is in office today for a nurse visit for Blood Pressure Check. Patient blood pressure was 140/100, Patient denies chest pain, SOB, headache, lightheaded.  Blood pressure after 5 minutes 124/80.   Dr Sirivol recommended continue with same medication. No changes.  She asked refill of Methocarbamol  500 mg at bedtime for low back pain and sciatic nerve pain. Dr Sirivol approved the refill.

## 2023-11-24 ENCOUNTER — Ambulatory Visit
Admission: RE | Admit: 2023-11-24 | Discharge: 2023-11-24 | Disposition: A | Discharge status: Home / Self Care | Payer: Managed Care, Other (non HMO) | Source: Ambulatory Visit

## 2023-11-24 DIAGNOSIS — Z0000 Encounter for general adult medical examination without abnormal findings: Secondary | ICD-10-CM

## 2024-01-14 ENCOUNTER — Other Ambulatory Visit (HOSPITAL_BASED_OUTPATIENT_CLINIC_OR_DEPARTMENT_OTHER): Payer: Self-pay

## 2024-01-14 MED ORDER — FLUZONE 0.5 ML IM SUSY
0.5000 mL | PREFILLED_SYRINGE | Freq: Once | INTRAMUSCULAR | 0 refills | Status: AC
  Filled 2024-01-14: qty 0.5, 1d supply, fill #0

## 2024-02-27 ENCOUNTER — Other Ambulatory Visit: Payer: Self-pay | Admitting: Family Medicine

## 2024-03-07 ENCOUNTER — Ambulatory Visit: Payer: Self-pay

## 2024-03-07 NOTE — Telephone Encounter (Signed)
 Pt agreeable to UC as no in office appts available.  FYI Only or Action Required?: FYI only for provider: UC.  Patient was last seen in primary care on 10/21/2023 by Sherre Clapper, MD.  Called Nurse Triage reporting Cough.  Symptoms began several days ago.  Interventions attempted: OTC medications: Currently taking mucinex.  Symptoms are: gradually worsening.  Triage Disposition: See HCP Within 4 Hours (Or PCP Triage)  Patient/caregiver understands and will follow disposition?: Yes Reason for Disposition  Wheezing is present  Answer Assessment - Initial Assessment Questions Has been taking nyquil and dayquil, switched to mucinex DM yesterday.   1. ONSET: When did the cough begin?      03/01/24 2. SEVERITY: How bad is the cough today?      *No Answer* 3. SPUTUM: Describe the color of your sputum (e.g., none, dry cough; clear, white, yellow, green)     Yellow 4. HEMOPTYSIS: Are you coughing up any blood? If Yes, ask: How much? (e.g., flecks, streaks, tablespoons, etc.)     *No Answer* 5. DIFFICULTY BREATHING: Are you having difficulty breathing? If Yes, ask: How bad is it? (e.g., mild, moderate, severe)      Hearing rattling 6. FEVER: Do you have a fever? If Yes, ask: What is your temperature, how was it measured, and when did it start?     Denies 7. CARDIAC HISTORY: Do you have any history of heart disease? (e.g., heart attack, congestive heart failure)      *No Answer* 8. LUNG HISTORY: Do you have any history of lung disease?  (e.g., pulmonary embolus, asthma, emphysema)     *No Answer* 9. PE RISK FACTORS: Do you have a history of blood clots? (or: recent major surgery, recent prolonged travel, bedridden)     *No Answer* 10. OTHER SYMPTOMS: Do you have any other symptoms? (e.g., runny nose, wheezing, chest pain)       *No Answer* 11. PREGNANCY: Is there any chance you are pregnant? When was your last menstrual period?       *No Answer* 12.  TRAVEL: Have you traveled out of the country in the last month? (e.g., travel history, exposures)       *No Answer*  Protocols used: Cough - Acute Productive-A-AH Copied from CRM #[id number]. Topic: Clinical - Red Word Triage >> Mar 07, 2024  8:53 AM Emylou G wrote: [facility] that prompted transfer to Nurse Triage: coughing * hard rattling cough * productive: yellow phlem, congestion, voice issues.. no fever Past Medical History:  Diagnosis Date   Allergy    Arthritis    Dysmenorrhea    GERD (gastroesophageal reflux disease)    History of tubal ligation 05/30/2013   Overview:  Essure @ 8/13.   Migraine    Ovarian cyst    TMJ (dislocation of temporomandibular joint)
